# Patient Record
Sex: Female | Born: 1990 | Race: White | Hispanic: No | Marital: Single | Smoking: Current every day smoker
Health system: Southern US, Community
[De-identification: ages and names within clinical notes are randomized; demographics above are authoritative.]

## PROBLEM LIST (undated history)

## (undated) DIAGNOSIS — F1910 Other psychoactive substance abuse, uncomplicated: Secondary | ICD-10-CM

## (undated) DIAGNOSIS — R569 Unspecified convulsions: Secondary | ICD-10-CM

## (undated) DIAGNOSIS — F419 Anxiety disorder, unspecified: Secondary | ICD-10-CM

## (undated) HISTORY — PX: TONSILLECTOMY: SUR1361

## (undated) HISTORY — PX: APPENDECTOMY: SHX54

---
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
## 2008-05-30 ENCOUNTER — Observation Stay (HOSPITAL_COMMUNITY): Admission: AD | Admit: 2008-05-30 | Discharge: 2008-05-31 | Payer: Self-pay | Admitting: Obstetrics and Gynecology

## 2008-06-10 ENCOUNTER — Encounter: Admission: RE | Admit: 2008-06-10 | Discharge: 2008-07-03 | Payer: Self-pay | Admitting: Obstetrics & Gynecology

## 2010-10-12 ENCOUNTER — Inpatient Hospital Stay (HOSPITAL_COMMUNITY)
Admission: EM | Admit: 2010-10-12 | Discharge: 2010-10-14 | Payer: Self-pay | Source: Home / Self Care | Admitting: Emergency Medicine

## 2010-10-13 ENCOUNTER — Encounter (INDEPENDENT_AMBULATORY_CARE_PROVIDER_SITE_OTHER): Payer: Self-pay

## 2011-02-16 LAB — URINALYSIS, ROUTINE W REFLEX MICROSCOPIC
Bilirubin Urine: NEGATIVE
Glucose, UA: NEGATIVE mg/dL
Ketones, ur: NEGATIVE mg/dL
Nitrite: NEGATIVE
Protein, ur: NEGATIVE mg/dL
pH: 6 (ref 5.0–8.0)

## 2011-02-16 LAB — DIFFERENTIAL
Basophils Absolute: 0 10*3/uL (ref 0.0–0.1)
Lymphocytes Relative: 18 % (ref 12–46)
Lymphs Abs: 3 10*3/uL (ref 0.7–4.0)
Monocytes Absolute: 0.8 10*3/uL (ref 0.1–1.0)
Monocytes Relative: 4 % (ref 3–12)
Neutro Abs: 13 10*3/uL — ABNORMAL HIGH (ref 1.7–7.7)

## 2011-02-16 LAB — BASIC METABOLIC PANEL
Chloride: 105 mEq/L (ref 96–112)
GFR calc non Af Amer: >60 mL/min (ref >60)
Potassium: 3.9 mEq/L (ref 3.5–5.1)
Sodium: 140 mEq/L (ref 135–145)

## 2011-02-16 LAB — CBC
HCT: 45.6 % (ref 36.0–46.0)
Hemoglobin: 15.6 g/dL — ABNORMAL HIGH (ref 12.0–15.0)
WBC: 17 10*3/uL — ABNORMAL HIGH (ref 4.0–10.5)

## 2011-02-16 LAB — WET PREP, GENITAL

## 2011-02-16 LAB — PREGNANCY, URINE: Preg Test, Ur: NEGATIVE
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
## 2011-04-23 NOTE — Discharge Summary (Signed)
Charlotte Mitchell, Charlotte Mitchell               ACCOUNT NO.:  [id number]   MEDICAL RECORD NO.:  [id number]          PATIENT TYPE:  INP   LOCATION:  9142                          [facility]ILITY[facility]  [facility]   PHYSICIAN:  Juan H. Lily Peer, M.D.DATE OF BIRTH:  08-11-1991   DATE OF ADMISSION:  12/03/2006  DATE OF DISCHARGE:  12/06/2006                               DISCHARGE SUMMARY   TOTAL DAYS HOSPITALIZED:  Three.   HISTORY:  Patient is a 20 year old, gravida 2, para 0, abortions (AB) 1  at [redacted] weeks gestation who had been seen in the office for an ultrasound  for growth which revealed the patient to be in a breech presentation  with a low AFI of 4.4.  Patient was taken to the operating room for a  primary cesarean section.  Patient's pregnancy had been complicated due  to chronic smoking despite numerous times that she was counseled.  Also  in her pregnancy, she had positive bacterial vaginosis and positive  Chlamydia which were treated respectively.  The patient delivered by  primary lower uterine segment cesarean section by Dr. Colin Broach.  Patient delivered a viable female infant.  Apgars were 9 and 9 with a  weight of 7 pounds and 3 ounces.  Postoperatively, the patient did well.  Her diet was advanced from a clear to a regular diet.  Her patient  controlled analgesia (PCA) pump was discontinued after 24 hours as well  as her Foley catheter.  She began to ambulate, and by her third  postoperative day, she was up and ambulating, tolerating a regular diet  well, and passing flatus.  Her postoperative hemoglobin was 10.7, and  her blood type was O positive.   FINAL DIAGNOSIS:  1. Term intrauterine pregnancy.  2. Intrauterine growth restriction.  3. Oligohydramnios.  4. Homero Fellers breech presentation.   PROCEDURE PERFORMED:  Primary lower uterine segment transverse cesarean  section.   FINAL DISPOSITION AND FOLLOW-UP:  The patient was discharged home on the  third postoperative day.  She was up and  ambulating and tolerating a  regular diet well.  Her incision site was intact.  Her lochia had been  decreasing.  Patient was given a prescription for Lortab 5.0/500 mg to  take one p.o. q.4-6h. p.r.n. pain and also to continue her prenatal  vitamins and iron.  She was encouraged once again to discontinue  smoking, and she will be seen in the office in four weeks for a  postpartum visit.      Juan H. Lily Peer, M.D.  Electronically Signed     JHF/MEDQ  D:  12/06/2006  T:  12/06/2006  Job:  [id number]

## 2011-04-23 NOTE — Op Note (Signed)
NAMEAVAGRACE, BOTELHO               ACCOUNT NO.:  [id number]   MEDICAL RECORD NO.:  [id number]          PATIENT TYPE:  INP   LOCATION:  9142                          [facility]   PHYSICIAN:  Timothy P. Fontaine, M.D.DATE OF BIRTH:  December 05, 1991   DATE OF PROCEDURE:  12/03/2006  DATE OF DISCHARGE:                               OPERATIVE REPORT   PREOPERATIVE DIAGNOSIS:  Term pregnancy intrauterine growth retardation  evident by abdominal circumference third percentile, oligohydramnios  evident by AMI 4.4, frank breech presentation.   POSTOPERATIVE DIAGNOSES:  Term pregnancy intrauterine growth retardation  evident by abdominal circumference third percentile, oligohydramnios  evident by AMI 4.4, frank breech presentation.   PROCEDURE:  Primary low transverse cervical cesarean section.   SURGEON:  Timothy P. Fontaine, M.D.   ASSISTANT:  Scrub technician.   ANESTHETIC:  Spinal.   ESTIMATED BLOOD LOSS:  Less than 500 mL.   COMPLICATIONS:  None.   SPECIMEN:  Samples of cord blood, placenta, and umbilical cord.   FINDINGS:  At 0830 normal female, Apgars 9 and 9, frank breech  presentation, weight 7 pounds 3 ounces.  Pelvic anatomy noted to be  normal.   DESCRIPTION OF PROCEDURE:  The patient was taken to the operating room,  and underwent spinal anesthesia; was placed in the left tilt supine  position; received an abdominal preparation with Betadine solution;  bladder emptied with indwelling Foley catheterization; placed in sterile  technique.  The patient was draped in the usual fashion.  After assuring  adequate anesthesia, the abdomen was sharply entered through a  Pfannenstiel incision achieving adequate hemostasis at all levels.  The  bladder flap was sharply-bluntly developed without difficulty.  Uterus  was sharply entered in the lower uterine segment, and bluntly extended  laterally.  The bulging membranes ruptured; fluid noted to be clear.  The infant delivered at  the frank breech presentation.  The nares and  mouth suctioned.  The cord doubly clamped and cut; and the infant was  handed to pediatrics in attendance.  Samples of cord blood were  obtained.  Public cord blood banking was not consented.  The placenta  was spontaneously extruded and noted to be intact; and was sent to  pathology.  The uterus was exteriorized, endometrial cavity explored  with a sponge to remove all placental membrane fragments.   The patient received 1 gram Ancef antibiotic prophylaxis at this time.  The uterine incision was closed in two layers using #0 Vicryl suture  first in a running interlocking stitch, followed by an imbricating  stitch.  Uterus was returned to the abdomen which was copiously  irrigated.  Adequate hemostasis visualized.  The anterior fascia was  reapproximated using #0 Vicryl in a running suture.  The subcutaneous  tissues were irrigated.  Hemostasis achieved  with electrocautery.  The skin reapproximated using 4-0 Vicryl in a  running subcuticular stitch.  Steri-Strips, Benzoin applied.  Sterile  dressing applied.  The patient was taken to recovery room in good  condition having tolerated the procedure well.      Timothy P. Fontaine, M.D.  Electronically Signed  TPF/MEDQ  D:  12/03/2006  T:  12/03/2006  Job:  [id number]

## 2011-04-23 NOTE — H&P (Signed)
Charlotte Mitchell, Charlotte Mitchell               ACCOUNT NO.:  [id number]   MEDICAL RECORD NO.:  [id number]          PATIENT TYPE:  INP   LOCATION:                                [facility]   PHYSICIAN:  Timothy P. Fontaine, M.D.DATE OF BIRTH:  May 18, 1991   DATE OF ADMISSION:  12/03/2006  DATE OF DISCHARGE:                              HISTORY & PHYSICAL   She is being admitted to [facility] December 29, surgery 8 a.m.   CHIEF COMPLAINT:  Pregnancy at 39 weeks, breech presentation,  oligohydramnios.   HISTORY OF PRESENT ILLNESS:  A 20 year old G2, P0, AB1 female [redacted] weeks  gestation.  Ultrasound for growth revealed the patient to be in the  breech presentation with an AFI of 4.4 cm.  Abdominal circumference is  in the 3rd percentile and she is admitted at this time for a primary  cesarean section.  Prenatal course has been complicated by cigarette  smoking, positive BV, positive Chlamydia screen at her new OB.  Patient  was treated with antibiotics, had a follow up chlamydial and GC culture  October 07, 2006, which were negative.  Her beta Strep screen is also  negative.  For remainder of her history, see her [facility] Exam.  HEENT:  Normal.  LUNGS:  Clear.  CARDIAC:  Regular rate without rubs, murmurs or gallops.  ABDOMINAL:  Gravid fetus, Leopold's confirms breech, positive fetal  heart tones.  PELVIC:  Cervix tight, fingertip high.   ASSESSMENT AND PLAN:  A 20 year old gravida 2, para 0, abortus 1 female,  cigarette smoker, infant abdominal circumference in the 3rd percentile  at 39 weeks, amniotic fluid index 4.4 centimeters, breech presentation.  Options for management were reviewed and we all decide cesarean section  to be the most prudent course of action.  It is 5 p.m. at the time of  this dictation.  She has just eaten a meal prior to office visit.  Will  plan on nonstress test now for reassurance.  Assuming reactive nonstress  test at the hospital, will schedule for  cesarean section in the morning  and patient is to return in the morning for preoperative preparation.  If question with nonstress test, then will proceed with cesarean section  this evening despite having had a recent meal.  I reviewed what is  involved with a cesarean section, expected intraoperative, postoperative  courses, the risks, benefits and the risks of bleeding, transfusion,  infection prolonged antibiotics, wound complications requiring opening  and draining of incisions, closure by secondary intention, long term  wound complications to include hernia formation all reviewed.  The risk  of inadvertent injury to internal organs including bowel, bladder,  ureters, vessels and nerves necessitating major exploratory reparative  surgeries in the future, reparative surgeries including bowel resection,  ostomy formation, bladder repair all discussed, understood and accepted.  The risks of fetal injury during the birthing process, musculoskeletal,  neural, scalpel injuries were all reviewed.  The patient's questions  were answered to her satisfaction and she is ready to proceed with  surgery.      Timothy P. Fontaine, M.D.  Electronically Signed    TPF/MEDQ  D:  12/02/2006  T:  12/02/2006  Job:  [id number]

## 2011-06-10 ENCOUNTER — Inpatient Hospital Stay (HOSPITAL_COMMUNITY): Payer: Self-pay

## 2011-06-10 ENCOUNTER — Inpatient Hospital Stay (HOSPITAL_COMMUNITY)
Admission: AD | Admit: 2011-06-10 | Discharge: 2011-06-10 | Disposition: A | Discharge status: Home / Self Care | Payer: Self-pay | Source: Ambulatory Visit | Attending: Obstetrics and Gynecology | Admitting: Obstetrics and Gynecology

## 2011-06-10 DIAGNOSIS — R109 Unspecified abdominal pain: Secondary | ICD-10-CM | POA: Insufficient documentation

## 2011-06-10 DIAGNOSIS — O9989 Other specified diseases and conditions complicating pregnancy, childbirth and the puerperium: Secondary | ICD-10-CM

## 2011-06-10 DIAGNOSIS — O99891 Other specified diseases and conditions complicating pregnancy: Secondary | ICD-10-CM | POA: Insufficient documentation

## 2011-06-10 LAB — HCG, QUANTITATIVE, PREGNANCY: hCG, Beta Chain, Quant, S: 1242 m[IU]/mL — ABNORMAL HIGH (ref <5)

## 2011-06-10 LAB — URINALYSIS, ROUTINE W REFLEX MICROSCOPIC
Hgb urine dipstick: NEGATIVE
Protein, ur: NEGATIVE mg/dL
Urobilinogen, UA: 0.2 mg/dL (ref 0.0–1.0)

## 2011-06-10 LAB — URINE MICROSCOPIC-ADD ON

## 2011-06-10 LAB — WET PREP, GENITAL: Yeast Wet Prep HPF POC: NONE SEEN

## 2011-06-10 LAB — POCT PREGNANCY, URINE: Preg Test, Ur: POSITIVE

## 2011-06-12 ENCOUNTER — Inpatient Hospital Stay (HOSPITAL_COMMUNITY)
Admission: AD | Admit: 2011-06-12 | Discharge: 2011-06-12 | Disposition: A | Discharge status: Home / Self Care | Payer: Self-pay | Source: Ambulatory Visit | Attending: Family Medicine | Admitting: Family Medicine

## 2011-06-12 ENCOUNTER — Inpatient Hospital Stay (HOSPITAL_COMMUNITY): Payer: Self-pay | Admitting: Family Medicine

## 2011-06-12 ENCOUNTER — Encounter (HOSPITAL_COMMUNITY): Payer: Self-pay | Admitting: *Deleted

## 2011-06-12 ENCOUNTER — Ambulatory Visit (HOSPITAL_COMMUNITY): Payer: Self-pay

## 2011-06-12 DIAGNOSIS — O99891 Other specified diseases and conditions complicating pregnancy: Secondary | ICD-10-CM | POA: Insufficient documentation

## 2011-06-12 NOTE — Initial Assessments (Signed)
Pt states, " I am here for repeat labwork

## 2011-06-13 LAB — URINE CULTURE
Colony Count: 55000
Culture  Setup Time: 201207052254

## 2011-06-16 ENCOUNTER — Telehealth (HOSPITAL_COMMUNITY): Payer: Self-pay | Admitting: Obstetrics and Gynecology

## 2011-06-16 NOTE — ED Provider Notes (Signed)
Pt called regarding positive urine culture. No answer. Left message for pt to return call. Urine culture was positive for proteus mirabilis.    Henrietta Hoover, [place] 06/16/11 [phone]

## 2011-06-17 NOTE — ED Notes (Signed)
  Pt. Has not returned call. Will send letter. [address], FNP   [place], [place] 06/17/11 [phone]

## 2011-08-12 ENCOUNTER — Encounter (HOSPITAL_COMMUNITY): Payer: Self-pay | Admitting: Obstetrics and Gynecology

## 2011-08-12 ENCOUNTER — Inpatient Hospital Stay (HOSPITAL_COMMUNITY)
Admission: AD | Admit: 2011-08-12 | Discharge: 2011-08-12 | Disposition: A | Discharge status: Home / Self Care | Payer: Self-pay | Source: Ambulatory Visit | Attending: Obstetrics & Gynecology | Admitting: Obstetrics & Gynecology

## 2011-08-12 ENCOUNTER — Encounter (HOSPITAL_COMMUNITY): Payer: Self-pay | Admitting: *Deleted

## 2011-08-12 DIAGNOSIS — O99330 Smoking (tobacco) complicating pregnancy, unspecified trimester: Secondary | ICD-10-CM | POA: Insufficient documentation

## 2011-08-12 DIAGNOSIS — O99891 Other specified diseases and conditions complicating pregnancy: Secondary | ICD-10-CM | POA: Insufficient documentation

## 2011-08-12 DIAGNOSIS — Z3201 Encounter for pregnancy test, result positive: Secondary | ICD-10-CM | POA: Insufficient documentation

## 2011-08-12 DIAGNOSIS — K5900 Constipation, unspecified: Secondary | ICD-10-CM

## 2011-08-12 DIAGNOSIS — O2692 Pregnancy related conditions, unspecified, second trimester: Secondary | ICD-10-CM

## 2011-08-12 DIAGNOSIS — O2690 Pregnancy related conditions, unspecified, unspecified trimester: Secondary | ICD-10-CM

## 2011-08-12 LAB — WET PREP, GENITAL
Clue Cells Wet Prep HPF POC: NONE SEEN
Trich, Wet Prep: NONE SEEN
Yeast Wet Prep HPF POC: NONE SEEN

## 2011-08-12 LAB — URINALYSIS, ROUTINE W REFLEX MICROSCOPIC
Glucose, UA: NEGATIVE mg/dL
Ketones, ur: NEGATIVE mg/dL
Leukocytes, UA: NEGATIVE
pH: 6 (ref 5.0–8.0)

## 2011-08-12 NOTE — Progress Notes (Signed)
SSE done per Mayer Camel, NP.  Wet prep and cultures collected.  VE done.

## 2011-08-12 NOTE — Progress Notes (Signed)
Pt reports she was seen here 2 months ago and told she was pregnant. States we were unable to determine gest because we "couldn't see anything". States she returned for follow up BHCG's. Has been having cramping off/on for 2 months. LMP 04/20/2011

## 2011-08-12 NOTE — ED Provider Notes (Signed)
History     CSN: [id number] Arrival date & time: 08/12/2011  8:21 PM  Chief Complaint  Patient presents with  . Possible Pregnancy   HPI Charlotte Mitchell is a 20 y.o. female who presents to [facility] for follow up of her pregnancy. Problem with constipation. Has had occasional cramping. On previous visit patient had culture positive for chlamydia. She states she and her partner were treated. States she is here tonight to get a pregnancy verification letter so she can get her insurance and start her prenatal care. The history was provided by the patient.  Past Medical History  Diagnosis Date  . No pertinent past medical history     Past Surgical History  Procedure Date  . Cesarean section   . Appendectomy   . Tonsillectomy     No family history on file.  History  Substance Use Topics  . Smoking status: Current Everyday Smoker -- 0.5 packs/day for 9 years    Types: Cigarettes  . Smokeless tobacco: Never Used  . Alcohol Use: 0.6 oz/week    1 Cans of beer per week    OB History    Grav Para Term Preterm Abortions TAB SAB Ect Mult Living   4 2 2       2       Review of Systems  Gastrointestinal: Positive for constipation.       Occasional low abdominal cramping.  Genitourinary:       Pregnant.  All other systems reviewed and are negative.    Physical Exam  BP 120/81  Pulse 77  Temp 98.5 F (36.9 C)  Resp 16  Ht 5\' 4"  (1.626 m)  Wt 163 lb (73.936 kg)  BMI 27.98 kg/m2  LMP 04/20/2011  Physical Exam  Nursing note and vitals reviewed. Constitutional: She is oriented to person, place, and time. She appears well-developed and well-nourished.  Eyes: EOM are normal.  Neck: Neck supple.  Pulmonary/Chest: Effort normal.  Abdominal: Soft. There is no tenderness.       Positive FHT.  Genitourinary:       Yellow mucous discharge vaginal vault. Cervix long and closed. No CMT. Uterus 14 - 16 week size.  Musculoskeletal: Normal range of motion.  Neurological: She is alert and  oriented to person, place, and time. No cranial nerve deficit.  Skin: Skin is warm and dry.    ED Course  Procedures  MDM  Informal bedside ultrasound shows an active IUP with cardiac activity.   Assessment: Second trimester pregnancy    Constipation  Plan:  High fiber diet   Pregnancy verification letter     Kerrie Buffalo, NP 08/12/11 [phone]

## 2011-08-12 NOTE — Progress Notes (Signed)
Pt presents to [facility] for check up for pregnancy to get medicaid card.  Has been having cramping the last couple of nights.  Had prior complications with last 2 pregnancies.  Had emergency c/s for abruption with last pregnancy.

## 2011-08-12 NOTE — Progress Notes (Signed)
Bedside US done per Mayer Camel, NP.

## 2011-08-12 NOTE — Progress Notes (Signed)
H. Neese, NP at bedside.  Assessment done and poc discussed with pt.  

## 2011-08-15 NOTE — ED Provider Notes (Signed)
Agree with above note.  Charlotte Mitchell H. 08/15/2011 2:53 PM

## 2011-08-25 ENCOUNTER — Other Ambulatory Visit: Payer: Self-pay | Admitting: Obstetrics and Gynecology

## 2011-08-25 LAB — ABO/RH: RH Type: POSITIVE

## 2011-08-25 LAB — HIV ANTIBODY (ROUTINE TESTING W REFLEX): HIV: NONREACTIVE

## 2011-08-25 LAB — RPR: RPR: NONREACTIVE

## 2011-08-25 LAB — RUBELLA ANTIBODY, IGM: Rubella: IMMUNE

## 2011-09-02 LAB — TYPE AND SCREEN: Antibody Screen: NEGATIVE

## 2011-09-02 LAB — CBC
MCHC: 35
MCV: 100.5 — ABNORMAL HIGH
RBC: 3.54 — ABNORMAL LOW
RDW: 12.9

## 2011-09-02 LAB — PROTIME-INR
INR: 1
Prothrombin Time: 12.9

## 2011-11-21 IMAGING — CT CT ABD-PELV W/ CM
1 series · 15 of 32 positions shown, 19 images · IV contrast (omnipaque)
Comparison: None.

CLINICAL DATA: Right lower quadrant abdominal pain with nausea,
vomiting and diarrhea.

CT ABDOMEN AND PELVIS WITH CONTRAST
TECHNIQUE: Multidetector CT imaging of the abdomen and pelvis was
performed following the standard protocol during bolus
administration of intravenous contrast.
Contrast: 100 ml Omnipaque-IJJ intravenously.

[Series 2: rtn ap with st · axial · 0.58mm/px · z∈[+695,+1105]mm · 15 of 91 slices shown, 19 images]
[im 6/91  soft-tissue]
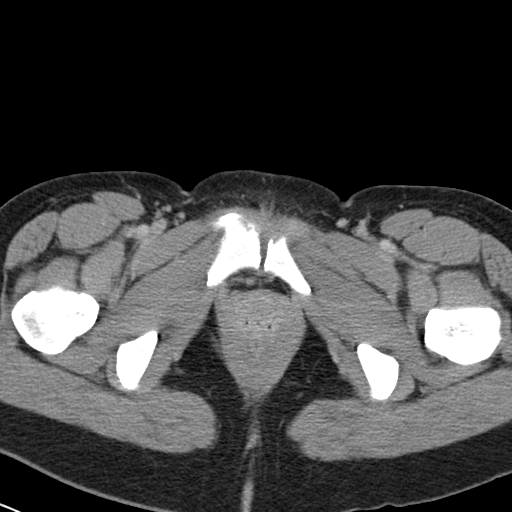
[im 6/91  bone]
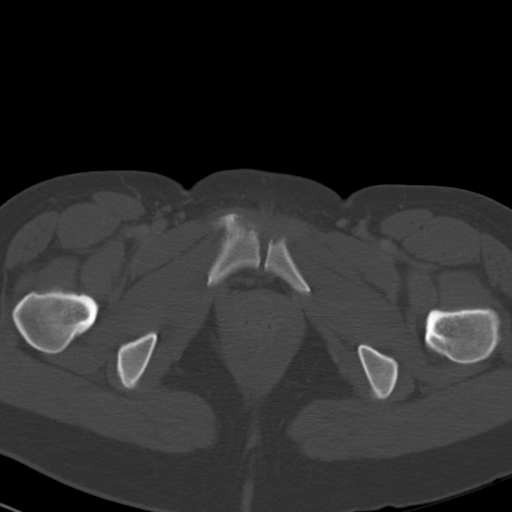
[im 12/91  soft-tissue]
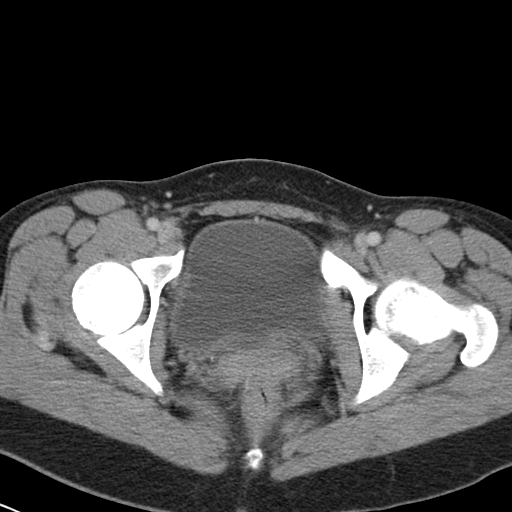
[im 18/91  soft-tissue]
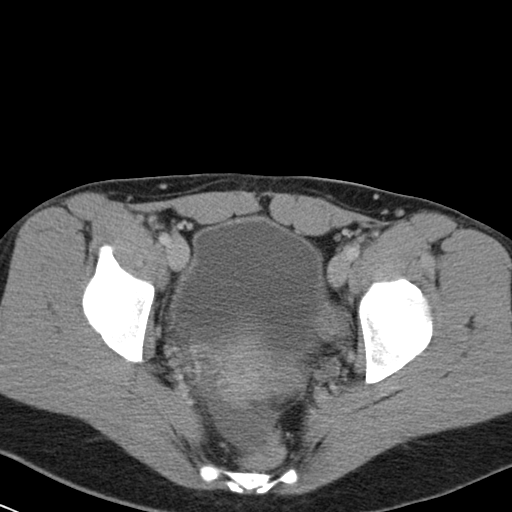
[im 27/91  soft-tissue]
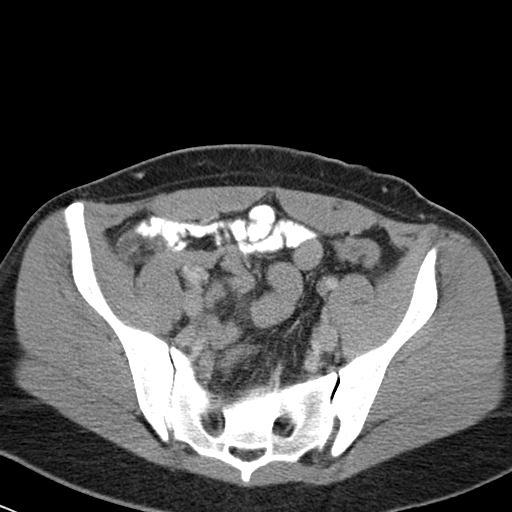
[im 32/91  soft-tissue]
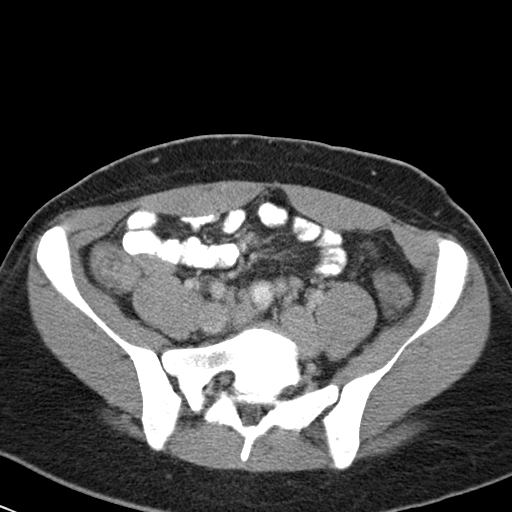
[im 38/91  soft-tissue]
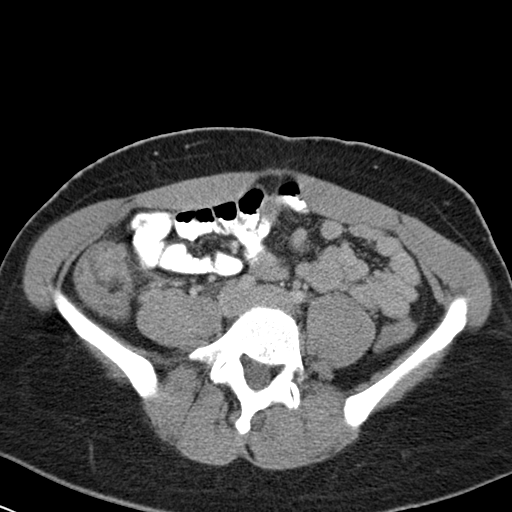
[im 47/91  soft-tissue]
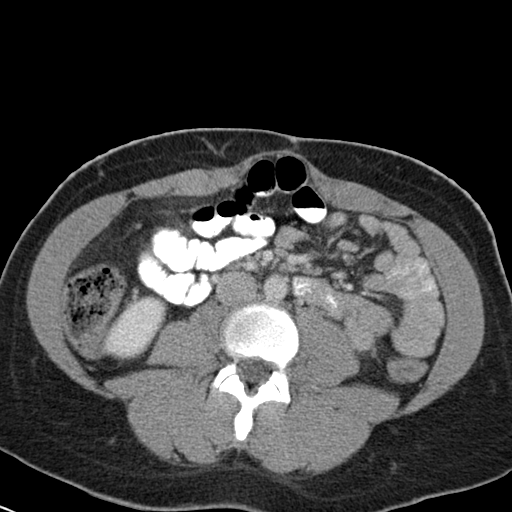
[im 53/91  soft-tissue]
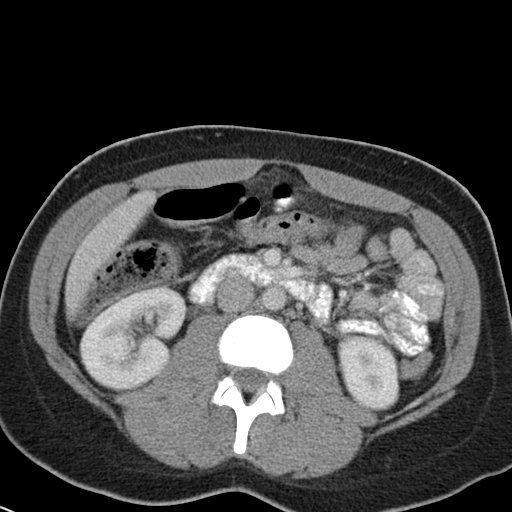
[im 59/91  soft-tissue]
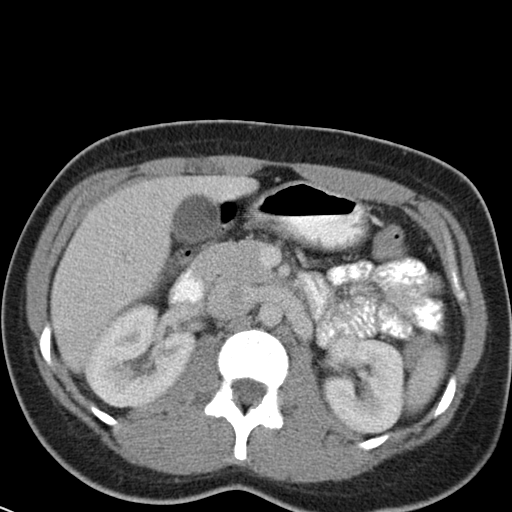
[im 59/91  bone]
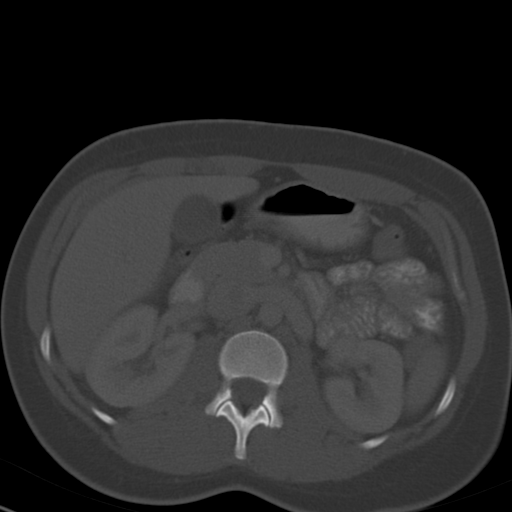
[im 64/91  soft-tissue]
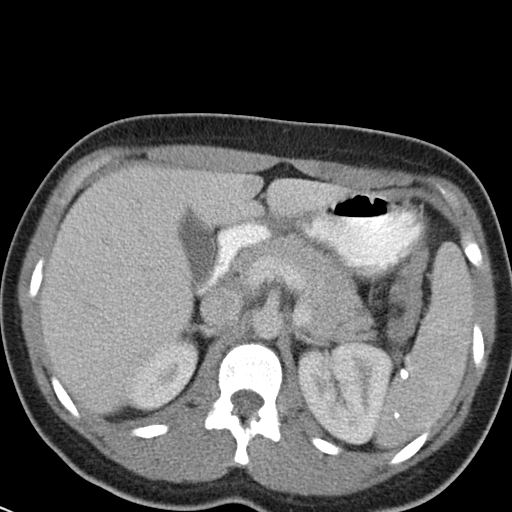
[im 73/91  soft-tissue]
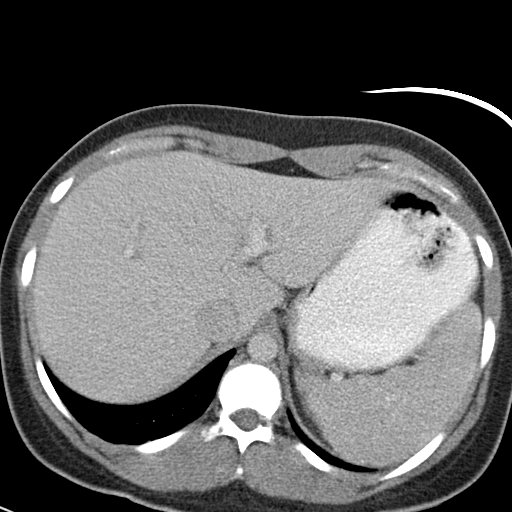
[im 79/91  soft-tissue]
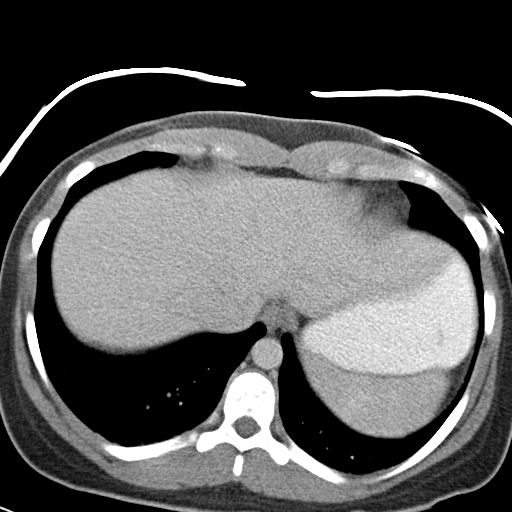
[im 79/91  lung]
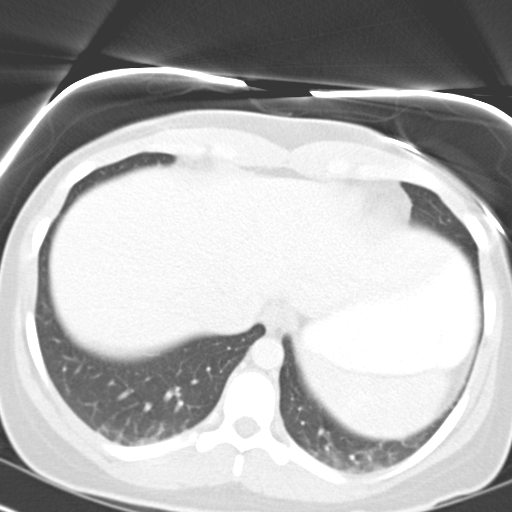
[im 82/91  lung]
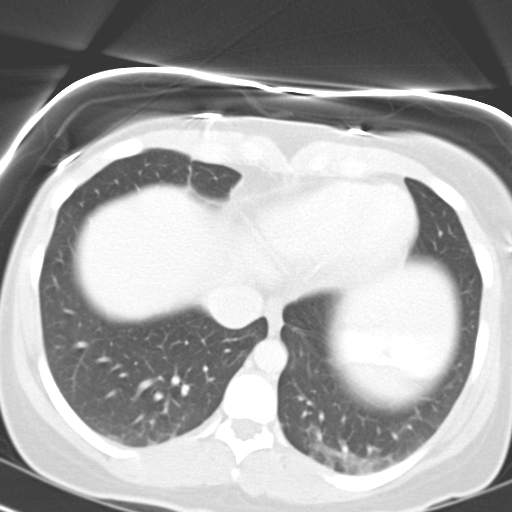
[im 85/91  soft-tissue]
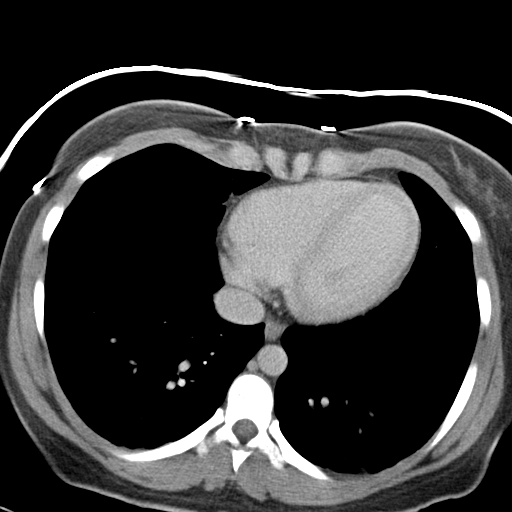
[im 85/91  lung]
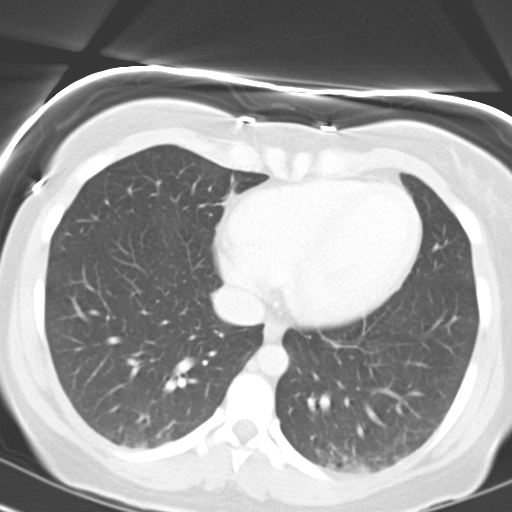
[im 88/91  lung]
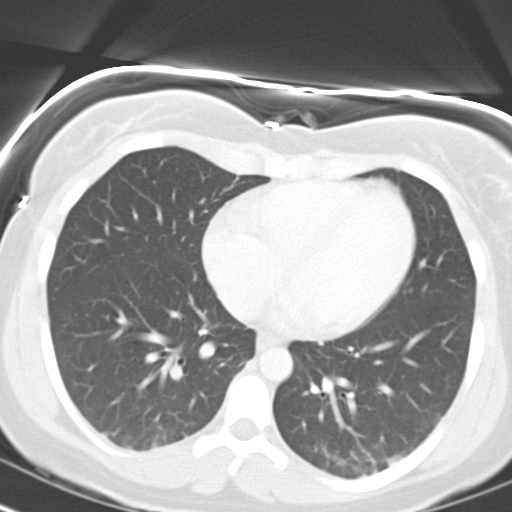

[15 of 32 positions shown; findings below may reference images not displayed]

FINDINGS: There is mild dependent atelectasis at both lung bases.
No pleural effusion is demonstrated.  There are small calcified
splenic granulomas.  The spleen, liver, gallbladder, pancreas,
adrenal glands and kidneys otherwise appear normal.

The appendix is distended to 11 mm and demonstrates wall thickening
and surrounding inflammation.  There are probable appendicoliths
within the appendiceal lumen at the base and near the tip of the
appendix.  There is no evidence of bowel obstruction.  A small
amount of free pelvic fluid is nonspecific.  There is a collapsing
right ovarian follicle.  The uterus, left ovary and urinary bladder
appear unremarkable.
IMPRESSION: 1.  Findings are consistent with acute appendicitis.  There is no
evidence of perforation or abscess.
2.  Collapsing right ovarian follicle likely accounts for a small
amount of free pelvic fluid.  There is no focal extraluminal fluid
collection.

Critical test results telephoned to Dr. Bolo Ndha at the time of
interpretation on 10/12/2010 at 6122 hours.

## 2012-01-17 ENCOUNTER — Encounter (HOSPITAL_COMMUNITY): Payer: Self-pay

## 2012-01-27 ENCOUNTER — Encounter (HOSPITAL_COMMUNITY): Payer: Self-pay

## 2012-01-28 ENCOUNTER — Encounter (HOSPITAL_COMMUNITY): Payer: Self-pay

## 2012-01-28 ENCOUNTER — Inpatient Hospital Stay (HOSPITAL_COMMUNITY): Admission: RE | Admit: 2012-01-28 | Payer: Medicaid Other | Source: Ambulatory Visit

## 2012-01-28 ENCOUNTER — Encounter (HOSPITAL_COMMUNITY)
Admission: RE | Admit: 2012-01-28 | Discharge: 2012-01-28 | Disposition: A | Discharge status: Home / Self Care | Payer: Medicaid Other | Source: Ambulatory Visit | Attending: Obstetrics and Gynecology | Admitting: Obstetrics and Gynecology

## 2012-01-28 LAB — SURGICAL PCR SCREEN
MRSA, PCR: NEGATIVE
Staphylococcus aureus: POSITIVE — AB

## 2012-01-28 LAB — CBC
HCT: 36.9 % (ref 36.0–46.0)
Hemoglobin: 12.4 g/dL (ref 12.0–15.0)
MCH: 33.2 pg (ref 26.0–34.0)
MCHC: 33.6 g/dL (ref 30.0–36.0)
RDW: 12.7 % (ref 11.5–15.5)

## 2012-01-28 NOTE — Patient Instructions (Addendum)
20 Charlotte Mitchell  01/28/2012   Your procedure is scheduled on:  02/03/12  Enter through the Main Entrance of [facility] at 6 AM.  Pick up the phone at the desk and dial [phone].   Call this number if you have problems the morning of surgery: [phone]   Remember:   Do not eat food:After Midnight.  Do not drink clear liquids: After Midnight.  Take these medicines the morning of surgery with A SIP OF WATER: NA   Do not wear jewelry, make-up or nail polish.  Do not wear lotions, powders, or perfumes. You may wear deodorant.  Do not shave 48 hours prior to surgery.  Do not bring valuables to the hospital.  Contacts, dentures or bridgework may not be worn into surgery.  Leave suitcase in the car. After surgery it may be brought to your room.  For patients admitted to the hospital, checkout time is 11:00 AM the day of discharge.   Patients discharged the day of surgery will not be allowed to drive home.  Name and phone number of your driver: NA  Special Instructions: CHG Shower Use Special Wash: 1/2 bottle night before surgery and 1/2 bottle morning of surgery.   Please read over the following fact sheets that you were given: MRSA Information

## 2012-02-03 ENCOUNTER — Encounter (HOSPITAL_COMMUNITY): Payer: Self-pay | Admitting: General Surgery

## 2012-02-03 ENCOUNTER — Encounter (HOSPITAL_COMMUNITY): Payer: Self-pay | Admitting: Anesthesiology

## 2012-02-03 ENCOUNTER — Encounter (HOSPITAL_COMMUNITY): Payer: Self-pay | Admitting: *Deleted

## 2012-02-03 ENCOUNTER — Other Ambulatory Visit: Payer: Self-pay | Admitting: Obstetrics and Gynecology

## 2012-02-03 ENCOUNTER — Inpatient Hospital Stay (HOSPITAL_COMMUNITY): Payer: Medicaid Other | Admitting: Anesthesiology

## 2012-02-03 ENCOUNTER — Inpatient Hospital Stay (HOSPITAL_COMMUNITY)
Admission: RE | Admit: 2012-02-03 | Discharge: 2012-02-06 | DRG: 766 | Disposition: A | Discharge status: Home / Self Care | Payer: Medicaid Other | Source: Ambulatory Visit | Attending: Obstetrics and Gynecology | Admitting: Obstetrics and Gynecology

## 2012-02-03 ENCOUNTER — Encounter (HOSPITAL_COMMUNITY)
Admission: RE | Disposition: A | Discharge status: Home / Self Care | Payer: Self-pay | Source: Ambulatory Visit | Attending: Obstetrics and Gynecology

## 2012-02-03 DIAGNOSIS — Z01812 Encounter for preprocedural laboratory examination: Secondary | ICD-10-CM

## 2012-02-03 DIAGNOSIS — Z01818 Encounter for other preprocedural examination: Secondary | ICD-10-CM

## 2012-02-03 DIAGNOSIS — O34219 Maternal care for unspecified type scar from previous cesarean delivery: Principal | ICD-10-CM | POA: Diagnosis present

## 2012-02-03 LAB — TYPE AND SCREEN
ABO/RH(D): O POS
Antibody Screen: NEGATIVE

## 2012-02-03 SURGERY — Surgical Case
Anesthesia: Spinal | Site: Abdomen | Wound class: Clean Contaminated

## 2012-02-03 MED ORDER — ONDANSETRON HCL 4 MG/2ML IJ SOLN: 4.0000 mg | INTRAMUSCULAR | Status: DC | PRN

## 2012-02-03 MED ORDER — ONDANSETRON HCL 4 MG/2ML IJ SOLN
INTRAMUSCULAR | Status: DC | PRN
  Administered 2012-02-03: 4 mg via INTRAVENOUS

## 2012-02-03 MED ORDER — IBUPROFEN 600 MG PO TABS
600.0000 mg | ORAL_TABLET | Freq: Four times a day (QID) | ORAL | Status: DC
  Administered 2012-02-03 – 2012-02-06 (×11): 600 mg via ORAL
  Filled 2012-02-03 (×3): qty 1
  Filled 2012-02-03: qty 2
  Filled 2012-02-03 (×7): qty 1

## 2012-02-03 MED ORDER — FLEET ENEMA 7-19 GM/118ML RE ENEM: 1.0000 | ENEMA | Freq: Every day | RECTAL | Status: DC | PRN

## 2012-02-03 MED ORDER — OXYCODONE-ACETAMINOPHEN 5-325 MG PO TABS
1.0000 | ORAL_TABLET | ORAL | Status: DC | PRN
  Administered 2012-02-03: 1 via ORAL
  Administered 2012-02-04 (×2): 2 via ORAL
  Administered 2012-02-04: 1 via ORAL
  Administered 2012-02-04: 2 via ORAL
  Administered 2012-02-05: 1 via ORAL
  Administered 2012-02-05 – 2012-02-06 (×3): 2 via ORAL
  Filled 2012-02-03: qty 2
  Filled 2012-02-03: qty 1
  Filled 2012-02-03 (×3): qty 2
  Filled 2012-02-03 (×2): qty 1
  Filled 2012-02-03 (×2): qty 2

## 2012-02-03 MED ORDER — BISACODYL 10 MG RE SUPP: 10.0000 mg | Freq: Every day | RECTAL | Status: DC | PRN

## 2012-02-03 MED ORDER — DIPHENHYDRAMINE HCL 50 MG/ML IJ SOLN: 12.5000 mg | INTRAMUSCULAR | Status: DC | PRN

## 2012-02-03 MED ORDER — KETOROLAC TROMETHAMINE 30 MG/ML IJ SOLN: 30.0000 mg | Freq: Four times a day (QID) | INTRAMUSCULAR | Status: AC | PRN

## 2012-02-03 MED ORDER — OXYTOCIN 10 UNIT/ML IJ SOLN
INTRAMUSCULAR | Status: AC
  Filled 2012-02-03: qty 2

## 2012-02-03 MED ORDER — OXYTOCIN 20 UNITS IN LACTATED RINGERS INFUSION - SIMPLE: 125.0000 mL/h | INTRAVENOUS | Status: AC

## 2012-02-03 MED ORDER — SIMETHICONE 80 MG PO CHEW: 80.0000 mg | CHEWABLE_TABLET | ORAL | Status: DC | PRN

## 2012-02-03 MED ORDER — DIPHENHYDRAMINE HCL 25 MG PO CAPS
25.0000 mg | ORAL_CAPSULE | ORAL | Status: DC | PRN
  Administered 2012-02-06: 25 mg via ORAL

## 2012-02-03 MED ORDER — KETOROLAC TROMETHAMINE 60 MG/2ML IM SOLN
INTRAMUSCULAR | Status: AC
  Administered 2012-02-03: 60 mg via INTRAMUSCULAR
  Filled 2012-02-03: qty 2

## 2012-02-03 MED ORDER — FENTANYL CITRATE 0.05 MG/ML IJ SOLN
INTRAMUSCULAR | Status: AC
  Administered 2012-02-03: 50 ug via INTRAVENOUS
  Filled 2012-02-03: qty 2

## 2012-02-03 MED ORDER — METOCLOPRAMIDE HCL 5 MG/ML IJ SOLN: 10.0000 mg | Freq: Three times a day (TID) | INTRAMUSCULAR | Status: DC | PRN

## 2012-02-03 MED ORDER — NALBUPHINE HCL 10 MG/ML IJ SOLN
5.0000 mg | INTRAMUSCULAR | Status: DC | PRN
  Filled 2012-02-03: qty 1

## 2012-02-03 MED ORDER — LACTATED RINGERS IV SOLN
INTRAVENOUS | Status: DC
  Administered 2012-02-03 (×2): via INTRAVENOUS
  Administered 2012-02-03: 1000 mL via INTRAVENOUS

## 2012-02-03 MED ORDER — PHENYLEPHRINE HCL 10 MG/ML IJ SOLN
INTRAMUSCULAR | Status: DC | PRN
  Administered 2012-02-03 (×2): 80 ug via INTRAVENOUS

## 2012-02-03 MED ORDER — MORPHINE SULFATE (PF) 0.5 MG/ML IJ SOLN
INTRAMUSCULAR | Status: DC | PRN
  Administered 2012-02-03: 4 mg via INTRAVENOUS
  Administered 2012-02-03: .15 mg via INTRATHECAL

## 2012-02-03 MED ORDER — DIPHENHYDRAMINE HCL 50 MG/ML IJ SOLN: 25.0000 mg | INTRAMUSCULAR | Status: DC | PRN

## 2012-02-03 MED ORDER — SENNOSIDES-DOCUSATE SODIUM 8.6-50 MG PO TABS
2.0000 | ORAL_TABLET | Freq: Every day | ORAL | Status: DC
  Administered 2012-02-03 – 2012-02-05 (×3): 2 via ORAL

## 2012-02-03 MED ORDER — BUPIVACAINE IN DEXTROSE 0.75-8.25 % IT SOLN: INTRATHECAL | Status: DC | PRN

## 2012-02-03 MED ORDER — ZOLPIDEM TARTRATE 5 MG PO TABS
5.0000 mg | ORAL_TABLET | Freq: Every evening | ORAL | Status: DC | PRN
  Administered 2012-02-04: 5 mg via ORAL
  Filled 2012-02-03: qty 1

## 2012-02-03 MED ORDER — NALOXONE HCL 0.4 MG/ML IJ SOLN: 0.4000 mg | INTRAMUSCULAR | Status: DC | PRN

## 2012-02-03 MED ORDER — KETOROLAC TROMETHAMINE 60 MG/2ML IM SOLN
60.0000 mg | Freq: Once | INTRAMUSCULAR | Status: AC | PRN
  Administered 2012-02-03: 60 mg via INTRAMUSCULAR

## 2012-02-03 MED ORDER — EPHEDRINE 5 MG/ML INJ
INTRAVENOUS | Status: AC
  Filled 2012-02-03: qty 10

## 2012-02-03 MED ORDER — NICOTINE 14 MG/24HR TD PT24
14.0000 mg | MEDICATED_PATCH | Freq: Every day | TRANSDERMAL | Status: AC
  Administered 2012-02-03: 14 mg via TRANSDERMAL
  Filled 2012-02-03 (×3): qty 1

## 2012-02-03 MED ORDER — DIPHENHYDRAMINE HCL 50 MG/ML IJ SOLN: 12.5000 mg | Freq: Four times a day (QID) | INTRAMUSCULAR | Status: DC | PRN

## 2012-02-03 MED ORDER — DIPHENHYDRAMINE HCL 12.5 MG/5ML PO ELIX
12.5000 mg | ORAL_SOLUTION | Freq: Four times a day (QID) | ORAL | Status: DC | PRN
  Filled 2012-02-03: qty 5

## 2012-02-03 MED ORDER — DIBUCAINE 1 % RE OINT: 1.0000 "application " | TOPICAL_OINTMENT | RECTAL | Status: DC | PRN

## 2012-02-03 MED ORDER — ONDANSETRON HCL 4 MG PO TABS: 4.0000 mg | ORAL_TABLET | ORAL | Status: DC | PRN

## 2012-02-03 MED ORDER — LACTATED RINGERS IV SOLN
INTRAVENOUS | Status: DC
  Administered 2012-02-03: 15:00:00 via INTRAVENOUS

## 2012-02-03 MED ORDER — FENTANYL CITRATE 0.05 MG/ML IJ SOLN
INTRAMUSCULAR | Status: AC
  Filled 2012-02-03: qty 2

## 2012-02-03 MED ORDER — OXYTOCIN 20 UNITS IN LACTATED RINGERS INFUSION - SIMPLE
INTRAVENOUS | Status: DC | PRN
  Administered 2012-02-03: 20 [IU] via INTRAVENOUS

## 2012-02-03 MED ORDER — IBUPROFEN 600 MG PO TABS: 600.0000 mg | ORAL_TABLET | Freq: Four times a day (QID) | ORAL | Status: DC | PRN

## 2012-02-03 MED ORDER — FERROUS SULFATE 325 (65 FE) MG PO TABS
325.0000 mg | ORAL_TABLET | Freq: Two times a day (BID) | ORAL | Status: DC
  Administered 2012-02-04 – 2012-02-06 (×5): 325 mg via ORAL
  Filled 2012-02-03 (×5): qty 1

## 2012-02-03 MED ORDER — PHENYLEPHRINE 40 MCG/ML (10ML) SYRINGE FOR IV PUSH (FOR BLOOD PRESSURE SUPPORT)
PREFILLED_SYRINGE | INTRAVENOUS | Status: AC
  Filled 2012-02-03: qty 5

## 2012-02-03 MED ORDER — ONDANSETRON HCL 4 MG/2ML IJ SOLN: 4.0000 mg | Freq: Three times a day (TID) | INTRAMUSCULAR | Status: DC | PRN

## 2012-02-03 MED ORDER — SIMETHICONE 80 MG PO CHEW
80.0000 mg | CHEWABLE_TABLET | Freq: Three times a day (TID) | ORAL | Status: DC
  Administered 2012-02-03 – 2012-02-05 (×9): 80 mg via ORAL

## 2012-02-03 MED ORDER — SCOPOLAMINE 1 MG/3DAYS TD PT72
MEDICATED_PATCH | TRANSDERMAL | Status: AC
  Filled 2012-02-03: qty 1

## 2012-02-03 MED ORDER — SODIUM CHLORIDE 0.9 % IJ SOLN: 3.0000 mL | INTRAMUSCULAR | Status: DC | PRN

## 2012-02-03 MED ORDER — DEXTROSE 5 % IV SOLN
2.0000 g | INTRAVENOUS | Status: AC
  Administered 2012-02-03: 2 g via INTRAVENOUS
  Filled 2012-02-03: qty 2

## 2012-02-03 MED ORDER — SCOPOLAMINE 1 MG/3DAYS TD PT72
1.0000 | MEDICATED_PATCH | Freq: Once | TRANSDERMAL | Status: DC
  Administered 2012-02-03: 1.5 mg via TRANSDERMAL

## 2012-02-03 MED ORDER — FENTANYL CITRATE 0.05 MG/ML IJ SOLN
INTRAMUSCULAR | Status: DC | PRN
  Administered 2012-02-03: 75 ug via INTRAVENOUS
  Administered 2012-02-03: 25 ug via INTRATHECAL
  Administered 2012-02-03: 100 ug via INTRAVENOUS

## 2012-02-03 MED ORDER — PRENATAL MULTIVITAMIN CH
1.0000 | ORAL_TABLET | Freq: Every day | ORAL | Status: DC
  Administered 2012-02-03 – 2012-02-06 (×4): 1 via ORAL
  Filled 2012-02-03 (×4): qty 1

## 2012-02-03 MED ORDER — MORPHINE SULFATE 0.5 MG/ML IJ SOLN
INTRAMUSCULAR | Status: AC
  Filled 2012-02-03: qty 10

## 2012-02-03 MED ORDER — HYDROMORPHONE 0.3 MG/ML IV SOLN
INTRAVENOUS | Status: AC
  Filled 2012-02-03: qty 25

## 2012-02-03 MED ORDER — SODIUM CHLORIDE 0.9 % IV SOLN
1.0000 ug/kg/h | INTRAVENOUS | Status: DC | PRN
  Filled 2012-02-03: qty 2.5

## 2012-02-03 MED ORDER — WITCH HAZEL-GLYCERIN EX PADS: 1.0000 "application " | MEDICATED_PAD | CUTANEOUS | Status: DC | PRN

## 2012-02-03 MED ORDER — MENTHOL 3 MG MT LOZG: 1.0000 | LOZENGE | OROMUCOSAL | Status: DC | PRN

## 2012-02-03 MED ORDER — ONDANSETRON HCL 4 MG/2ML IJ SOLN: 4.0000 mg | Freq: Four times a day (QID) | INTRAMUSCULAR | Status: DC | PRN

## 2012-02-03 MED ORDER — TETANUS-DIPHTH-ACELL PERTUSSIS 5-2.5-18.5 LF-MCG/0.5 IM SUSP: 0.5000 mL | Freq: Once | INTRAMUSCULAR | Status: DC

## 2012-02-03 MED ORDER — EPHEDRINE SULFATE 50 MG/ML IJ SOLN
INTRAMUSCULAR | Status: DC | PRN
  Administered 2012-02-03: 20 mg via INTRAVENOUS

## 2012-02-03 MED ORDER — SODIUM CHLORIDE 0.9 % IJ SOLN: 9.0000 mL | INTRAMUSCULAR | Status: DC | PRN

## 2012-02-03 MED ORDER — HYDROMORPHONE 0.3 MG/ML IV SOLN
INTRAVENOUS | Status: DC
  Administered 2012-02-03: 0.9 mg via INTRAVENOUS
  Administered 2012-02-03: 10:00:00 via INTRAVENOUS
  Administered 2012-02-03: 0.599 mg via INTRAVENOUS

## 2012-02-03 MED ORDER — FENTANYL CITRATE 0.05 MG/ML IJ SOLN
25.0000 ug | INTRAMUSCULAR | Status: DC | PRN
  Administered 2012-02-03 (×4): 50 ug via INTRAVENOUS

## 2012-02-03 MED ORDER — METHYLERGONOVINE MALEATE 0.2 MG/ML IJ SOLN: 0.2000 mg | INTRAMUSCULAR | Status: DC | PRN

## 2012-02-03 MED ORDER — DIPHENHYDRAMINE HCL 25 MG PO CAPS
25.0000 mg | ORAL_CAPSULE | Freq: Four times a day (QID) | ORAL | Status: DC | PRN
  Filled 2012-02-03: qty 1

## 2012-02-03 MED ORDER — MEASLES, MUMPS & RUBELLA VAC ~~LOC~~ INJ
0.5000 mL | INJECTION | Freq: Once | SUBCUTANEOUS | Status: DC
  Filled 2012-02-03: qty 0.5

## 2012-02-03 MED ORDER — SCOPOLAMINE 1 MG/3DAYS TD PT72
1.0000 | MEDICATED_PATCH | Freq: Once | TRANSDERMAL | Status: DC
  Filled 2012-02-03: qty 1

## 2012-02-03 MED ORDER — METHYLERGONOVINE MALEATE 0.2 MG PO TABS: 0.2000 mg | ORAL_TABLET | ORAL | Status: DC | PRN

## 2012-02-03 MED ORDER — MEPERIDINE HCL 25 MG/ML IJ SOLN: 6.2500 mg | INTRAMUSCULAR | Status: DC | PRN

## 2012-02-03 MED ORDER — LANOLIN HYDROUS EX OINT: 1.0000 "application " | TOPICAL_OINTMENT | CUTANEOUS | Status: DC | PRN

## 2012-02-03 MED ORDER — ONDANSETRON HCL 4 MG/2ML IJ SOLN
INTRAMUSCULAR | Status: AC
  Filled 2012-02-03: qty 2

## 2012-02-03 MED ORDER — 0.9 % SODIUM CHLORIDE (POUR BTL) OPTIME
TOPICAL | Status: DC | PRN
  Administered 2012-02-03: 1000 mL

## 2012-02-03 SURGICAL SUPPLY — 31 items
CHLORAPREP W/TINT 26ML (MISCELLANEOUS) ×2 IMPLANT
CLOTH BEACON ORANGE TIMEOUT ST (SAFETY) ×2 IMPLANT
DRESSING TELFA 8X3 (GAUZE/BANDAGES/DRESSINGS) ×1 IMPLANT
DRSG PAD ABDOMINAL 8X10 ST (GAUZE/BANDAGES/DRESSINGS) ×1 IMPLANT
ELECT REM PT RETURN 9FT ADLT (ELECTROSURGICAL) ×2
ELECTRODE REM PT RTRN 9FT ADLT (ELECTROSURGICAL) ×1 IMPLANT
EXTRACTOR VACUUM BELL STYLE (SUCTIONS) IMPLANT
GLOVE BIO SURGEON STRL SZ7 (GLOVE) ×5 IMPLANT
GOWN PREVENTION PLUS LG XLONG (DISPOSABLE) ×6 IMPLANT
KIT ABG SYR 3ML LUER SLIP (SYRINGE) IMPLANT
NDL HYPO 25X5/8 SAFETYGLIDE (NEEDLE) ×1 IMPLANT
NEEDLE HYPO 25X5/8 SAFETYGLIDE (NEEDLE) ×2 IMPLANT
NS IRRIG 1000ML POUR BTL (IV SOLUTION) ×2 IMPLANT
PACK C SECTION WH (CUSTOM PROCEDURE TRAY) ×2 IMPLANT
RETRACTOR WND ALEXIS 25 LRG (MISCELLANEOUS) ×1 IMPLANT
RTRCTR WOUND ALEXIS 25CM LRG (MISCELLANEOUS) ×2
SLEEVE SCD COMPRESS KNEE MED (MISCELLANEOUS) IMPLANT
SPONGE GAUZE 4X4 12PLY (GAUZE/BANDAGES/DRESSINGS) ×1 IMPLANT
STAPLER VISISTAT 35W (STAPLE) IMPLANT
SUT MNCRL 0 VIOLET CTX 36 (SUTURE) ×2 IMPLANT
SUT MONOCRYL 0 CTX 36 (SUTURE) ×2
SUT PDS AB 0 CTX 60 (SUTURE) IMPLANT
SUT PLAIN 2 0 XLH (SUTURE) ×1 IMPLANT
SUT VIC AB 0 CT1 27 (SUTURE) ×4
SUT VIC AB 0 CT1 27XBRD ANBCTR (SUTURE) ×2 IMPLANT
SUT VIC AB 2-0 CT1 27 (SUTURE) ×2
SUT VIC AB 2-0 CT1 TAPERPNT 27 (SUTURE) ×1 IMPLANT
TAPE CLOTH SURG 4X10 WHT LF (GAUZE/BANDAGES/DRESSINGS) ×1 IMPLANT
TOWEL OR 17X24 6PK STRL BLUE (TOWEL DISPOSABLE) ×4 IMPLANT
TRAY FOLEY CATH 14FR (SET/KITS/TRAYS/PACK) ×2 IMPLANT
WATER STERILE IRR 1000ML POUR (IV SOLUTION) ×2 IMPLANT

## 2012-02-03 NOTE — Anesthesia Preprocedure Evaluation (Signed)
Anesthesia Evaluation  Patient identified by MRN, date of birth, ID band Patient awake    Reviewed: Allergy & Precautions, H&P , NPO status , Patient's Chart, lab work & pertinent test results  Airway Mallampati: II TM Distance: >3 FB Neck ROM: full    Dental No notable dental hx.    Pulmonary neg pulmonary ROS,    Pulmonary exam normal       Cardiovascular neg cardio ROS Normal    Neuro/Psych Negative Neurological ROS  Negative Psych ROS   GI/Hepatic negative GI ROS, Neg liver ROS,   Endo/Other    Renal/GU negative Renal ROS  Genitourinary negative   Musculoskeletal negative musculoskeletal ROS (+)   Abdominal Normal abdominal exam  (+)   Peds negative pediatric ROS (+)  Hematology negative hematology ROS (+)   Anesthesia Other Findings   Reproductive/Obstetrics (+) Pregnancy                           Anesthesia Physical Anesthesia Plan  ASA: II  Anesthesia Plan: Spinal   Post-op Pain Management:    Induction:   Airway Management Planned:   Additional Equipment:   Intra-op Plan:   Post-operative Plan:   Informed Consent: I have reviewed the patients History and Physical, chart, labs and discussed the procedure including the risks, benefits and alternatives for the proposed anesthesia with the patient or authorized representative who has indicated his/her understanding and acceptance.     Plan Discussed with: Surgeon  Anesthesia Plan Comments:         Anesthesia Quick Evaluation

## 2012-02-03 NOTE — Transfer of Care (Signed)
Immediate Anesthesia Transfer of Care Note  Patient: Charlotte Mitchell  Procedure(s) Performed: Procedure(s) (LRB): CESAREAN SECTION (N/A)  Patient Location: PACU  Anesthesia Type: Spinal  Level of Consciousness: awake, alert  and oriented  Airway & Oxygen Therapy: Patient Spontanous Breathing  Post-op Assessment: Report given to PACU RN and Post -op Vital signs reviewed and stable  Post vital signs: Reviewed and stable  Complications: No apparent anesthesia complications

## 2012-02-03 NOTE — Anesthesia Procedure Notes (Signed)
Spinal  Additional Notes Spinal Dosage in OR  Bupivicaine ml       1.3 PFMS04   mcg        150 Fentanyl mcg            25    

## 2012-02-03 NOTE — Anesthesia Postprocedure Evaluation (Signed)
Anesthesia Post Note  Patient: Charlotte Mitchell  Procedure(s) Performed: Procedure(s) (LRB): CESAREAN SECTION (N/A)  Anesthesia type: Spinal  Patient location: PACU  Post pain: Pain level controlled  Post assessment: Post-op Vital signs reviewed  Last Vitals:  Filed Vitals:   02/03/12 0915  BP:   Pulse:   Temp:   Resp: 16    Post vital signs: Reviewed  Level of consciousness: awake  Complications: No apparent anesthesia complications

## 2012-02-03 NOTE — Progress Notes (Signed)
UR chart review completed.  

## 2012-02-03 NOTE — H&P (Signed)
21 y.o.  39 wks G3P2002 comes in for a repeat cesarean section at term.  Patient has good fetal movement and no bleeding.   Past Medical History  Diagnosis Date  . No pertinent past medical history     Past Surgical History  Procedure Date  . Cesarean section   . Appendectomy   . Tonsillectomy     OB History    Grav Para Term Preterm Abortions TAB SAB Ect Mult Living   3 2 2       2      # Outc Date GA Lbr Len/2nd Wgt Sex Del Anes PTL Lv   1 TRM 12/07    F CS EPI  Yes   2 TRM 8/09    M CS EPI  Yes   3 CUR               History   Social History  . Marital Status: Single    Spouse Name: N/A    Number of Children: N/A  . Years of Education: N/A   Occupational History  . Not on file.   Social History Main Topics  . Smoking status: Current Everyday Smoker -- 0.5 packs/day for 9 years    Types: Cigarettes  . Smokeless tobacco: Never Used  . Alcohol Use: 0.6 oz/week    1 Cans of beer per week  . Drug Use: No  . Sexually Active: Yes   Other Topics Concern  . Not on file   Social History Narrative  . No narrative on file   Review of patient's allergies indicates no known allergies.   Prenatal Course: uncomplicated.  There were no vitals filed for this visit.   Lungs/Cor:  NAD Abdomen:  soft, gravid Ex:  no cords, erythema SVE:  NA FHTs:  Present.  A/P   For repeat cesarean sectionat term.  All risks, benefits and alternatives discussed with patient and she desires to proceed.  Pt is currently uncertain about tubal ligation; will consider BTL later.  Martie Muhlbauer A

## 2012-02-03 NOTE — Op Note (Addendum)
02/03/2012  8:08 AM  PATIENT:  Charlotte Mitchell  21 y.o. female  PRE-OPERATIVE DIAGNOSIS:  REPEAT c/s at term  POST-OPERATIVE DIAGNOSIS:  REPEAT  PROCEDURE:  Procedure(s) (LRB): CESAREAN SECTION (N/A)  SURGEON:  Surgeon(s) and Role:    * Loney Laurence, MD - Primary    * Miguel Aschoff, MD - Assisting  ANESTHESIA:   spinal  EBL:  Total I/O In: 2000 [I.V.:2000] Out: 775 [Urine:75; Blood:700]   SPECIMEN:  No Specimen  DISPOSITION OF SPECIMEN:  N/A  COUNTS:  YES  PLAN OF CARE: Admit to inpatient   PATIENT DISPOSITION:  PACU - hemodynamically stable.   Delay start of Pharmacological VTE agent (>24hrs) due to surgical blood loss or risk of bleeding: not applicable  Complications:  none Medications:  Ancef, Pitocin Findings:  Baby female, Apgars 9,9, weight P.   Normal tubes, ovaries and uterus seen.  Technique:  After adequate spinal anesthesia was achieved, the patient was prepped and draped in usual sterile fashion.  A foley catheter was used to drain the bladder.  A pfannanstiel incision was made with the scalpel and carried down to the fascia with the bovie cautery. The fascia was incised in the midline with the scalpel and carried in a transverse curvilinear manner bilaterally.  The fascia was reflected superiorly and inferiorly off the rectus muscles and the muscles split in the midline.  A bowel free portion of the peritoneum was entered bluntly and then extended in a superior and inferior manner with good visualization of the bowel and bladder.  The Alexis instrument was then placed and the vesico-uterine fascia tented up and incised in a transverse curvilinear manner.  A 2 cm incision was made in the upper portion of the lower uterine segment until the amnion was exposed.  Clear fluid was noted and the baby delivered in the vertex presentation without complication.  The baby was bulb suctioned and the cord was clamped and cut.  The baby was then handed to awaiting  Neonatology.  The placenta was then delivered manually and the uterus cleared of all debris.  The uterine incision was then closed with a running lock stitch of 0 monocryl.  An imbricating layer of 0 monocryl was closed as well. Good hemostasis of the uterine incision was achieved, the abdomen was cleared with irrigation and the peritoneum was closed with a running stitch of 2-0 vicryl.  This incorporated the rectus muscles as a separate layer.  The fascia was then closed with a running stitch of 0 vicryl.  The subcutaneous layer was closed with interrupted  stitches of 2-0 plain gut.  The skin was closed with staples.  The patient tolerated the procedure well and was returned to the recovery room in stable condition.  All counts were correct times three.  Lenola Lockner A

## 2012-02-03 NOTE — Brief Op Note (Addendum)
02/03/2012  8:08 AM  PATIENT:  Charlotte Mitchell  20 y.o. female  PRE-OPERATIVE DIAGNOSIS:  REPEAT c/s at term  POST-OPERATIVE DIAGNOSIS:  REPEAT  PROCEDURE:  Procedure(s) (LRB): CESAREAN SECTION (N/A)  SURGEON:  Surgeon(s) and Role:    * Tashay Bozich A Lantz Hermann, MD - Primary    * Allan Ross, MD - Assisting  ANESTHESIA:   spinal  EBL:  Total I/O In: 2000 [I.V.:2000] Out: 775 [Urine:75; Blood:700]   SPECIMEN:  No Specimen  DISPOSITION OF SPECIMEN:  N/A  COUNTS:  YES  PLAN OF CARE: Admit to inpatient   PATIENT DISPOSITION:  PACU - hemodynamically stable.   Delay start of Pharmacological VTE agent (>24hrs) due to surgical blood loss or risk of bleeding: not applicable  Complications:  none Medications:  Ancef, Pitocin Findings:  Baby female, Apgars 9,9, weight P.   Normal tubes, ovaries and uterus seen.  Technique:  After adequate spinal anesthesia was achieved, the patient was prepped and draped in usual sterile fashion.  A foley catheter was used to drain the bladder.  A pfannanstiel incision was made with the scalpel and carried down to the fascia with the bovie cautery. The fascia was incised in the midline with the scalpel and carried in a transverse curvilinear manner bilaterally.  The fascia was reflected superiorly and inferiorly off the rectus muscles and the muscles split in the midline.  A bowel free portion of the peritoneum was entered bluntly and then extended in a superior and inferior manner with good visualization of the bowel and bladder.  The Alexis instrument was then placed and the vesico-uterine fascia tented up and incised in a transverse curvilinear manner.  A 2 cm incision was made in the upper portion of the lower uterine segment until the amnion was exposed.  Clear fluid was noted and the baby delivered in the vertex presentation without complication.  The baby was bulb suctioned and the cord was clamped and cut.  The baby was then handed to awaiting  Neonatology.  The placenta was then delivered manually and the uterus cleared of all debris.  The uterine incision was then closed with a running lock stitch of 0 monocryl.  An imbricating layer of 0 monocryl was closed as well. Good hemostasis of the uterine incision was achieved, the abdomen was cleared with irrigation and the peritoneum was closed with a running stitch of 2-0 vicryl.  This incorporated the rectus muscles as a separate layer.  The fascia was then closed with a running stitch of 0 vicryl.  The subcutaneous layer was closed with interrupted  stitches of 2-0 plain gut.  The skin was closed with staples.  The patient tolerated the procedure well and was returned to the recovery room in stable condition.  All counts were correct times three.  [name] A     

## 2012-02-04 ENCOUNTER — Encounter (HOSPITAL_COMMUNITY): Payer: Self-pay | Admitting: Obstetrics and Gynecology

## 2012-02-04 LAB — CBC
Hemoglobin: 9.8 g/dL — ABNORMAL LOW (ref 12.0–15.0)
MCH: 33.4 pg (ref 26.0–34.0)
MCV: 101.4 fL — ABNORMAL HIGH (ref 78.0–100.0)
Platelets: 120 10*3/uL — ABNORMAL LOW (ref 150–400)
RBC: 2.93 MIL/uL — ABNORMAL LOW (ref 3.87–5.11)
WBC: 10.8 10*3/uL — ABNORMAL HIGH (ref 4.0–10.5)

## 2012-02-04 NOTE — Anesthesia Postprocedure Evaluation (Signed)
  Anesthesia Post-op Note  Patient: Charlotte Mitchell  Procedure(s) Performed: Procedure(s) (LRB): CESAREAN SECTION (N/A)  Patient Location: PACU and Mother/Baby  Anesthesia Type: Spinal  Level of Consciousness: awake, alert  and oriented  Airway and Oxygen Therapy: Patient Spontanous Breathing  Post-op Pain: none  Post-op Assessment: Post-op Vital signs reviewed, Patient's Cardiovascular Status Stable, No headache, No backache, No residual numbness and No residual motor weakness  Post-op Vital Signs: Reviewed and stable  Complications: No apparent anesthesia complications

## 2012-02-04 NOTE — Addendum Note (Signed)
Addendum  created 02/04/12 0810 by Madison Hickman, CRNA   Modules edited:Notes Section

## 2012-02-04 NOTE — Progress Notes (Signed)
Patient ID: Charlotte Mitchell, female   DOB: [birth year], 20 y.o.   MRN: [id number]   POD#1 s/p Repeat C/S  S: Doing well, pain controlled, voiding well since catheter d/cd O:  Filed Vitals:   02/03/12 1825 02/03/12 1950 02/04/12 0005 02/04/12 0505  BP: 108/60 106/69 104/78 111/78  Pulse: 69 83 82 76  Temp: 99.2 F (37.3 C) 98 F (36.7 C) 98 F (36.7 C) 97.9 F (36.6 C)  TempSrc: Oral Oral Oral Oral  Resp: 16 18 18 18   Height:      Weight:      SpO2: 98% 98% 98% 99%   Aox3, NAD Abd soft, appropr tender, ND Dressing C/D/I  CBC    Component Value Date/Time   WBC 10.8* 02/04/2012 0546   RBC 2.93* 02/04/2012 0546   HGB 9.8* 02/04/2012 0546   HCT 29.7* 02/04/2012 0546   PLT 120* 02/04/2012 0546   MCV 101.4* 02/04/2012 0546   MCH 33.4 02/04/2012 0546   MCHC 33.0 02/04/2012 0546   RDW 13.1 02/04/2012 0546   LYMPHSABS 3.0 10/12/2010 2005   MONOABS 0.8 10/12/2010 2005   EOSABS 0.2 10/12/2010 2005   BASOSABS 0.0 10/12/2010 2005    A/P 1) Routine PO care 2) Encourage ambulation 3) Bottle feeding

## 2012-02-05 MED ORDER — SALINE SPRAY 0.65 % NA SOLN
1.0000 | NASAL | Status: DC | PRN
  Administered 2012-02-05: 1 via NASAL
  Filled 2012-02-05: qty 44

## 2012-02-05 NOTE — Progress Notes (Signed)
Subjective: Postpartum Day 2: Cesarean Delivery Patient reports incisional pain, tolerating PO, + flatus, + BM and no problems voiding.    Objective: Vital signs in last 24 hours: Filed Vitals:   02/04/12 0900 02/04/12 1404 02/04/12 2125 02/05/12 0543  BP: 122/73 110/68 104/67 108/70  Pulse: 72 67 73 70  Temp: 97.6 F (36.4 C) 98.7 F (37.1 C) 97.9 F (36.6 C) 97.4 F (36.3 C)  TempSrc: Oral Oral Oral Oral  Resp: 18 18 18 18   Height:      Weight:      SpO2: 99%       Physical Exam:  General: alert, cooperative and appears stated age 21: appropriate Uterine Fundus: firm Incision: healing well DVT Evaluation: No evidence of DVT seen on physical exam.   Basename 02/04/12 0546  HGB 9.8*  HCT 29.7*    Assessment/Plan: Status post Cesarean section. Doing well postoperatively.  Breastfeeding Continue current care. Considering early D/C but still undecided  Niva Murren H. 02/05/2012, 12:03 PM

## 2012-02-05 NOTE — Discharge Summary (Signed)
Obstetric Discharge Summary Reason for Admission: cesarean section Prenatal Procedures: ultrasound Intrapartum Procedures: cesarean: low cervical, transverse Postpartum Procedures: none Complications-Operative and Postpartum: none Hemoglobin  Date Value Range Status  02/04/2012 9.8* 12.0-15.0 (g/dL) Final     HCT  Date Value Range Status  02/04/2012 29.7* 36.0-46.0 (%) Final    Discharge Diagnoses: Term Pregnancy-delivered  Discharge Information: Date: 02/05/2012 Activity: pelvic rest Diet: routine Medications: Ibuprofen and Percocet Condition: stable Instructions: refer to practice specific booklet Discharge to: home   Newborn Data: Live born female  Birth Weight: 6 lb 12.6 oz (3080 g) APGAR: 9, 9  Home with mother.  Melanee Cordial H. 02/05/2012, 9:39 AM

## 2012-02-06 MED ORDER — SALINE NASAL SPRAY 0.65 % NA SOLN: 1.0000 | NASAL | Status: DC | PRN

## 2012-02-06 MED ORDER — HYDROCORTISONE 1 % EX OINT: TOPICAL_OINTMENT | Freq: Two times a day (BID) | CUTANEOUS | Status: AC

## 2012-02-06 MED ORDER — OXYCODONE-ACETAMINOPHEN 5-325 MG PO TABS: 2.0000 | ORAL_TABLET | ORAL | Status: AC | PRN

## 2014-09-30 ENCOUNTER — Encounter (HOSPITAL_COMMUNITY): Payer: Self-pay | Admitting: Emergency Medicine

## 2014-09-30 ENCOUNTER — Emergency Department (HOSPITAL_COMMUNITY): Payer: Medicaid Other

## 2014-09-30 ENCOUNTER — Emergency Department (HOSPITAL_COMMUNITY)
Admission: EM | Admit: 2014-09-30 | Discharge: 2014-09-30 | Disposition: A | Discharge status: Home / Self Care | Payer: Medicaid Other | Attending: Emergency Medicine | Admitting: Emergency Medicine

## 2014-09-30 DIAGNOSIS — Y9289 Other specified places as the place of occurrence of the external cause: Secondary | ICD-10-CM | POA: Diagnosis not present

## 2014-09-30 DIAGNOSIS — Z720 Tobacco use: Secondary | ICD-10-CM | POA: Insufficient documentation

## 2014-09-30 DIAGNOSIS — S91342A Puncture wound with foreign body, left foot, initial encounter: Secondary | ICD-10-CM | POA: Insufficient documentation

## 2014-09-30 DIAGNOSIS — Z79899 Other long term (current) drug therapy: Secondary | ICD-10-CM | POA: Diagnosis not present

## 2014-09-30 DIAGNOSIS — S90852A Superficial foreign body, left foot, initial encounter: Secondary | ICD-10-CM

## 2014-09-30 DIAGNOSIS — Y9389 Activity, other specified: Secondary | ICD-10-CM | POA: Diagnosis not present

## 2014-09-30 DIAGNOSIS — M795 Residual foreign body in soft tissue: Secondary | ICD-10-CM

## 2014-09-30 DIAGNOSIS — Z23 Encounter for immunization: Secondary | ICD-10-CM | POA: Diagnosis not present

## 2014-09-30 DIAGNOSIS — W450XXA Nail entering through skin, initial encounter: Secondary | ICD-10-CM | POA: Diagnosis not present

## 2014-09-30 MED ORDER — OXYCODONE-ACETAMINOPHEN 5-325 MG PO TABS
2.0000 | ORAL_TABLET | Freq: Once | ORAL | Status: AC
  Administered 2014-09-30: 2 via ORAL
  Filled 2014-09-30: qty 2

## 2014-09-30 MED ORDER — TETANUS-DIPHTH-ACELL PERTUSSIS 5-2.5-18.5 LF-MCG/0.5 IM SUSP
0.5000 mL | Freq: Once | INTRAMUSCULAR | Status: AC
  Administered 2014-09-30: 0.5 mL via INTRAMUSCULAR
  Filled 2014-09-30: qty 0.5

## 2014-09-30 MED ORDER — CIPROFLOXACIN HCL 500 MG PO TABS: 500.0000 mg | ORAL_TABLET | Freq: Two times a day (BID) | ORAL | Status: DC

## 2014-09-30 NOTE — ED Provider Notes (Signed)
CSN: [id number]     Arrival date & time 09/30/14  1403 History   This chart was scribed for Oswaldo ConroyVictoria Yolinda Duerr, PA-C working with Arby BarretteMarcy Pfeiffer, MD by Evon Slackerrance Branch, ED Scribe. This patient was seen in room TR08C/TR08C and the patient's care was started at 4:33 PM.     Chief Complaint  Patient presents with  . Foreign Body in Skin   The history is provided by the patient. No language interpreter was used.   HPI Comments: Charlotte Mitchell is a 23 y.o. female who presents to the Emergency Department complaining of left foot injury onset 4 hours prior. Describes the pain as sharp and throbbing. She states that she stepped on a nail while wearing no shoes. She states that the nail is about 3 inches long. Patient endorses mild erythema surrounding area without edema. Denies fever, nausea, vomiting. She states that she is unsure if her tetanus is up to date.      Past Medical History  Diagnosis Date  . No pertinent past medical history    Past Surgical History  Procedure Laterality Date  . Cesarean section    . Appendectomy    . Tonsillectomy    . Cesarean section  02/03/2012    Procedure: CESAREAN SECTION;  Surgeon: Loney LaurenceMichelle A Horvath, MD;  Location: [facility];  Service: Gynecology;  Laterality: N/A;   History reviewed. No pertinent family history. History  Substance Use Topics  . Smoking status: Current Every Day Smoker -- 0.50 packs/day for 9 years    Types: Cigarettes  . Smokeless tobacco: Never Used  . Alcohol Use: No   OB History   Grav Para Term Preterm Abortions TAB SAB Ect Mult Living   3 3 3       3      Review of Systems  Constitutional: Negative for fever and chills.  Gastrointestinal: Negative for nausea and vomiting.  Skin: Positive for wound.  All other systems reviewed and are negative.     Allergies  Hydrocodone  Home Medications   Prior to Admission medications   Medication Sig Start Date End Date Taking? Authorizing Provider  prenatal vitamin w/FE, FA  (PRENATAL 1 + 1) 27-1 MG TABS Take 1 tablet by mouth daily.      Historical Provider, MD  sodium chloride (OCEAN NASAL SPRAY) 0.65 % nasal spray Place 1 spray into the nose as needed for congestion. 02/06/12 02/05/13  Kendra H. Tenny Crawoss, MD   Triage Vitals: BP 110/73  Pulse 93  Temp(Src) 98.4 F (36.9 C) (Oral)  Resp 24  Ht 5\' 4"  (1.626 m)  Wt 160 lb (72.576 kg)  BMI 27.45 kg/m2  SpO2 100%  Physical Exam  Nursing note and vitals reviewed. Constitutional: She appears well-developed and well-nourished. No distress.  HENT:  Head: Normocephalic and atraumatic.  Eyes: Conjunctivae and EOM are normal. Right eye exhibits no discharge. Left eye exhibits no discharge.  Cardiovascular: Normal rate, regular rhythm and normal heart sounds.   Pulmonary/Chest: Effort normal and breath sounds normal. No respiratory distress. She has no wheezes.  Abdominal: Soft. Bowel sounds are normal. She exhibits no distension. There is no tenderness.  Musculoskeletal: She exhibits tenderness.  Left Foot: 2-3cm nail inside left foot on medial sole of foot near first metatarsal, mild erythema, no edema or swelling, Less than 3 sec cap refill, 2+ distal pulses equal bilaterally, full ROM of ankle , no tenderness in leg  Neurological: She is alert. She exhibits normal muscle tone. Coordination normal.  Skin:  Skin is warm and dry. She is not diaphoretic.    ED Course  Procedures (including critical care time) DIAGNOSTIC STUDIES: Oxygen Saturation is 100% on RA, normal by my interpretation.    COORDINATION OF CARE: 5:25 PM-Discussed treatment plan which includes consult with attending with pt at bedside and pt agreed to plan.     Labs Review Labs Reviewed - No data to display  Imaging Review Dg Foot Complete Left  09/30/2014   CLINICAL DATA:  Foreign body in soft tissue, stepped on nail  EXAM: LEFT FOOT - COMPLETE 3+ VIEW  COMPARISON:  None.  FINDINGS: There is a nail imbedded in the soft tissues of the medial  aspect of the foot at the level of the mid distal left first metatarsal. No fracture is seen. No malalignment is noted. Tarsal gas metatarsal alignment is normal. Joint spaces appear normal.  IMPRESSION: Foreign body nail in the soft tissues of the medial aspects left foot. No bony involvement.   Electronically Signed   By: Dwyane DeePaul  Barry M.D.   On: 09/30/2014 17:10     EKG Interpretation None     Meds given in ED:  Medications  oxyCODONE-acetaminophen (PERCOCET/ROXICET) 5-325 MG per tablet 2 tablet (2 tablets Oral Given 09/30/14 1737)  Tdap (BOOSTRIX) injection 0.5 mL (0.5 mLs Intramuscular Given 09/30/14 1746)    Discharge Medication List as of 09/30/2014  6:05 PM    START taking these medications   Details  ciprofloxacin (CIPRO) 500 MG tablet Take 1 tablet (500 mg total) by mouth 2 (two) times daily., Starting 09/30/2014, Until Discontinued, Print          MDM   Final diagnoses:  Foreign body (FB) in soft tissue   Patient with nail in left medial sole of foot under first metatarsal. Pain controled in ED. VSS. X-ray without signs of bone involvement. Case discussed with Dr. Clarice PolePfeifer who quickly removed nail from foot. Immediate bleeding that was easily stanched. Puncture wound thoroughly irrigated. Local antibiotic ointment applied and sterile dressing. Tetanus updated today. Treat with Cipro to cover possible pseudomonas infection prophylactically. Treat pain with NSAIDs.  Discussed return precautions with patient. Discussed all results and patient verbalizes understanding and agrees with plan.  This is a shared patient. This patient was discussed with the physician who saw and evaluated the patient and agrees with the plan.  I personally performed the services described in this documentation, which was scribed in my presence. The recorded information has been reviewed and is accurate.     Charlotte SjogrenVictoria L Lessie Funderburke, PA-C 10/01/14 [phone]

## 2014-09-30 NOTE — ED Notes (Signed)
Pt reports stepped on nail about an hour ago; nail still in place sticking out of bottom of L foot

## 2014-09-30 NOTE — Discharge Instructions (Signed)
Please take all of your antibiotics until finished!   You may develop abdominal discomfort or diarrhea from the antibiotic.  You may help offset this with probiotics which you can buy or get in yogurt. Do not eat  or take the probiotics until 2 hours after your antibiotic.  Ibuprofen 400mg  (2 tablets 200mg ) every 5-6 hours for 3-5 days and then as needed for pain. Follow up with PCP, urgent care or ED if symptoms worsen or signs of infection see below  Keep wound dry and do not remove dressing for 24 hours if possible. After that, wash gently morning and night (every 12 hours) with soap and water. Use a topical antibiotic ointment and cover with a bandaid or gauze.    Do NOT use rubbing alcohol or hydrogen peroxide, do not soak the area   Every attempt was made to remove foreign body (contaminants) from the wound.  However, there is always a chance that some may remain in the wound. This can  increase your risk of infection.   If you see signs of infection (warmth, redness, tenderness, pus, sharp increase in pain, fever, red streaking in the skin) immediately return to the emergency department.    Emergency Department Resource Guide 1) Find a Doctor and Pay Out of Pocket Although you won't have to find out who is covered by your insurance plan, it is a good idea to ask around and get recommendations. You will then need to call the office and see if the doctor you have chosen will accept you as a new patient and what types of options they offer for patients who are self-pay. Some doctors offer discounts or will set up payment plans for their patients who do not have insurance, but you will need to ask so you aren't surprised when you get to your appointment.  2) Contact Your Local Health Department Not all health departments have doctors that can see patients for sick visits, but many do, so it is worth a call to see if yours does. If you don't know where your local health department is, you can  check in your phone book. The CDC also has a tool to help you locate your state's health department, and many state websites also have listings of all of their local health departments.  3) Find a Walk-in Clinic If your illness is not likely to be very severe or complicated, you may want to try a walk in clinic. These are popping up all over the country in pharmacies, drugstores, and shopping centers. They're usually staffed by nurse practitioners or physician assistants that have been trained to treat common illnesses and complaints. They're usually fairly quick and inexpensive. However, if you have serious medical issues or chronic medical problems, these are probably not your best option.  No Primary Care Doctor: - Call Health Connect at  [phone] - they can help you locate a primary care doctor that  accepts your insurance, provides certain services, etc. - Physician Referral Service- [phone]  Chronic Pain Problems: Organization         Address  Phone   Notes  [facility]  [phone] Patients need to be referred by their primary care doctor.   Medication Assistance: Organization         Address  Phone   Notes  [facility] [facility] [address] [place], [place] [address] [phone] --Must be a resident of [place] [facility] -- Must have NO  insurance coverage whatsoever (no Medicaid/ Medicare, etc.) -- The pt. MUST have a primary care doctor that directs their care regularly and follows them in the community   [facility]ist  [phone]   [employer]  [phone]    Agencies that provide inexpensive medical care: Organization         Address  Phone   Notes  [facility]  [phone]   [facility]    [phone]   [facility] [address] [place], [place] [address] [phone]   [facility] [address], [place] [phone]    Planned Parenthood    [phone]) [phone]   [facility]    [phone]   Community Health and [facility]  [address], [place] Phone:  [phone], Fax:  [phone] Hours of Operation:  9 am - 6 pm, M-F.  Also accepts Medicaid/Medicare and self-pay.  [facility]  [address], [place] Phone: [phone]) [phone], Fax: [phone]. Hours of Operation:  8:30 am - 5:30 pm, M-F.  Also accepts Medicaid and self-pay.  [facility] [place] [address], [place] Phone: [phone]   [facility] [address], [place], [place] [phone], Ext. [phone] Mondays & Thursdays: 7-9 AM.  First 15 patients are seen on a first come, first serve basis.    Medicaid-accepting [facility] Providers:  Organization         Address  Phone   Notes  [facility] [address], [place] [phone] Also accepts self-pay patients.  [facility] [address], [place]  [phone]   [facility] [address], [place] [phone]   [facility] [address] [place] [address], [place] [phone]) [phone]   Renaye RakersVeita Bland [address], [place]   [phone] Only accepts [place] Access [place] patients after they have their name applied to their card.   Self-Pay (no insurance) in [facility]:  Organization         Address  Phone   Notes  Sickle Cell Patients, [facility] [address], [place] [phone]) [phone]   [facility] [address], [place] [phone]   [facility] [place]  [address],  [phone]   [facility]/Dr. Osei-Bonsu  [address] [place] [address], [place] or [phone], [place] [phone] Phone number for both [place] and [place] locations is the  same.  [facility]gent [facility] and [facility] [address], [place] [phone]   [facility] [place] [address] [place] [address], [place] or [address] [phone]   [facility] [address], [place] [phone], phone; [phone], fax Sees patients 1st and 3rd Saturday of every month.  Must not qualify for public or private insurance (i.e. Medicaid, Medicare, [place] Health Choice, Veterans' Benefits)  Household income should be no more than 200% of the poverty level The clinic cannot treat you if you are pregnant or think you are pregnant  Sexually transmitted diseases are not treated at the clinic.    Dental Care: Organization         Address  Phone  Notes  [facility] [address] [place], [place] [phone] Accepts children up to age 100 who are enrolled in [place] or [place] Health Choice; pregnant women with a Medicaid card; and children who have applied for Medicaid or [facility] Health Choice, but were declined, whose parents can pay a reduced fee at time of service.  [facility] Department of [facility]  [address], [place] [phone] Accepts children up to age 35 who are enrolled in [place] or [place] Health Choice; pregnant women with a Medicaid card; and children who have applied for Medicaid or  Health Choice, but were declined, whose parents can pay a reduced fee at time of service.  [facility]  [address] [place], [place] [phone] Patients are seen by appointment only. Walk-ins are not accepted. [facility] will see patients 54 years of age and older. Monday - Tuesday (8am-5pm) Most Wednesdays (8:30-5pm) $30 per visit, cash only  [facility] Adult Dental Access PROGRAM  [address], [facility] [phone] Patients are seen by appointment only. Walk-ins are not accepted. [facility] will see patients  35 years of age and older. One Wednesday Evening (Monthly: Volunteer Based).  $30 per visit, cash only  [employer]  [phone] for adults; Children under age 30, call Graduate Pediatric Dentistry at [phone]. Children aged 61-14, please call [phone] to request a pediatric application.  Dental services are provided in all areas of dental care including fillings, crowns and bridges, complete and partial dentures, implants, gum treatment, root canals, and extractions. Preventive care is also provided. Treatment is provided to both adults and children. Patients are selected via a lottery and there is often a waiting list.   [facility] [address] [place]  [phone] [email or web address]   [facility] [address] [place], [place] [phone], Ext. [phone] Second and Fourth Thursday of each month, opens at 6:30 AM; Clinic ends at 9 AM.  Patients are seen on a first-come first-served basis, and a limited number are seen during each clinic.   [facility]  [address] [place]ins [place], [place] [phone]   Eligibility Requirements You must have lived in [place], [place], or [place] counties for at least the last three months.   You cannot be eligible for state or federal sponsored [place], including [employer], [place], or [employer].   You generally cannot be eligible for healthcare insurance through your employer.    How to apply: Eligibility screenings are held every Tuesday and Wednesday afternoon from 1:00 pm until 4:00 pm. You do not need an appointment for the interview!  [facility] [address], [place], [place] [phone]   [facility] Health Department  [phone]   [facility] Health Department  [phone]   [facility] Health Department  [phone]    Behavioral Health Resources in the Community: Intensive Outpatient  Programs Organization         Address  Phone  Notes  [facility] [address] [place], [place] [phone]   [facility] Outpatient [address], [place], [place] [phone]   [facility] [address], [place], [place]  [phone]   [facility] [address]  [place], [place] [phone] or [phone]   Substance Abuse Resources Organization  Address  Phone  Notes  [facility] Drug Services  [phone]   [facility]  [phone]   [facility] [place]  [phone]   Floydene Flock  [phone]   Residential & Outpatient Substance Abuse Program  [phone]   Psychological Services Organization         Address  Phone  Notes  [facility] Health  [phone]   [facility] Services  [phone]   [facility] Mental [facility] [address], [place] [phone] or [phone]    Mobile Crisis Teams Organization         Address  Phone  Notes  [facility], Mobile Crisis Care Unit  [phone]   [facility]  [address] [place], [place] [phone]   Doristine Locks [address] [place] [phone]    Self-Help/Support Groups Organization         Address  Phone             Notes  [facility] [place] - variety of support groups  [phone]- I7437963 Call for more information  Narcotics Anonymous (NA), [facility] [address], [employer] [place]  2 meetings at this location   Statistician         Address  Phone  Notes  [facility] [address],    [place]  [phone]   [facility]  [address] [place] [address], [place], [place] [phone]   [facility] [address] [place], [place] [phone] Admissions: 8am-3pm M-F  [facility] [address]    [place], [place]  [phone]   [facility] [address] [place], [place], [place] [phone]   [facility] [address],  [place], [place] [phone]   [facility] - Intensive Outpatient [address], [place], [place] [phone]   [facility] ([facility] [address]  [place], [place] [phone] or [phone]   [facility] ([facility]) [address], [place], [place] [phone] Accepts Medicaid  [facility] [place] [address]  [place] [phone] Substance Abuse/Addiction Treatment   [facility] Organization         Address  Phone  Notes  [facility]  [phone]   Angie Fava, PhD [address] [place], [place]   [phone] or [phone]   [facility]   [address] [place], [place] [phone]   [facility] [address] [place], [place] [phone] Insurance/Medicaid/sponsorship through [facility] and Families [address]                                    [place], [place] [phone] Therapy/tele-psych/case  [facility] [address], [place] [phone]    Dr. Lolly Mustache  [phone]   [facility]  [employer] [facility] Dept. 1) [address] [place] 2) [address], [place] 3)  [address], [place] [phone]  [phone]   [facility] Child Abuse Hotline (252) 567-0836 or [phone] (After Hours)

## 2014-10-01 NOTE — ED Provider Notes (Signed)
Medical screening examination/treatment/procedure(s) were conducted as a shared visit with non-physician practitioner(s) and myself.  I personally evaluated the patient during the encounter.   EKG Interpretation None     I evaluated the patient for puncture wound having stepped on a nail. A similar nail was brought in. There are no phalanges on it.  I have reviewed the x-ray and there is no evidence of intrusion into the bone. It is lateral in the soft tissues.  Under direct examination of the nail was protruding from the medial plantar surface of the foot. With quick steady traction that was removed without any difficulty. Orders were subsequently given for irrigation and cleaning of the wound and antibiotic therapy. The patient be started on ciprofloxacin  Arby BarretteMarcy Keng Jewel, MD 10/01/14 [phone]

## 2014-10-07 ENCOUNTER — Encounter (HOSPITAL_COMMUNITY): Payer: Self-pay | Admitting: Emergency Medicine

## 2014-12-06 DIAGNOSIS — R569 Unspecified convulsions: Secondary | ICD-10-CM

## 2014-12-06 HISTORY — DX: Unspecified convulsions: R56.9

## 2015-11-09 IMAGING — CR DG FOOT COMPLETE 3+V*L*
3 series · 3 of 3 positions shown · non-contrast
Comparison: None.

CLINICAL DATA: Foreign body in soft tissue, stepped on nail

EXAM:
LEFT FOOT - COMPLETE 3+ VIEW

[t foot ap left]
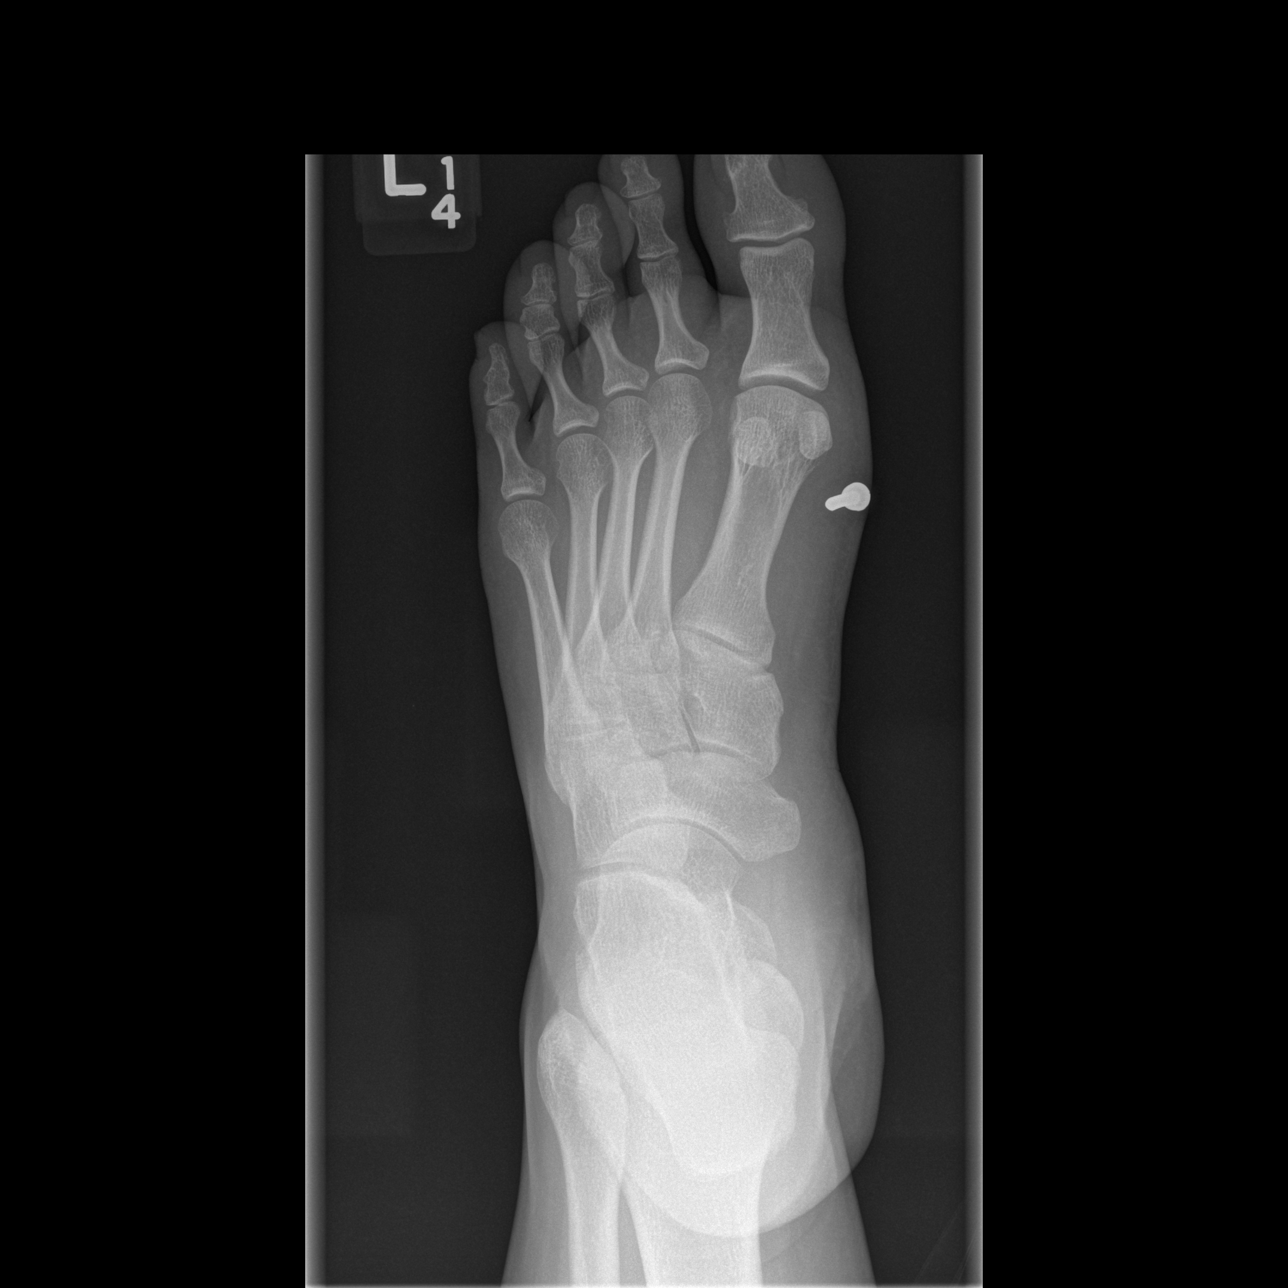

[t foot oblique left]
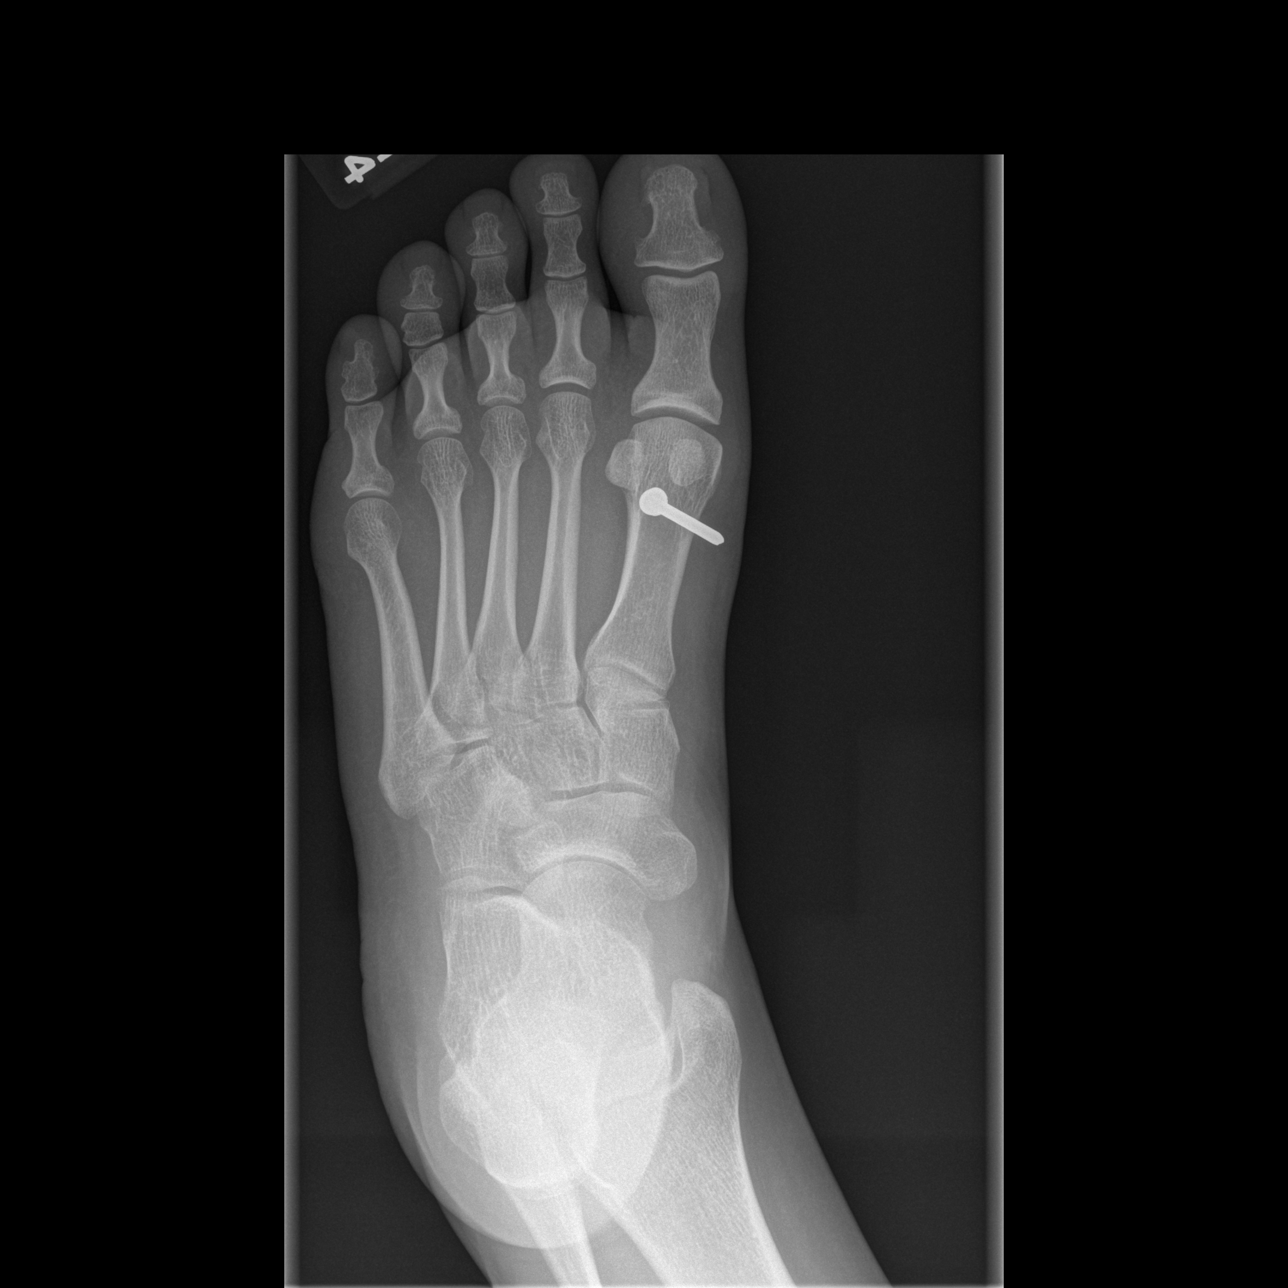

[t foot lat left]
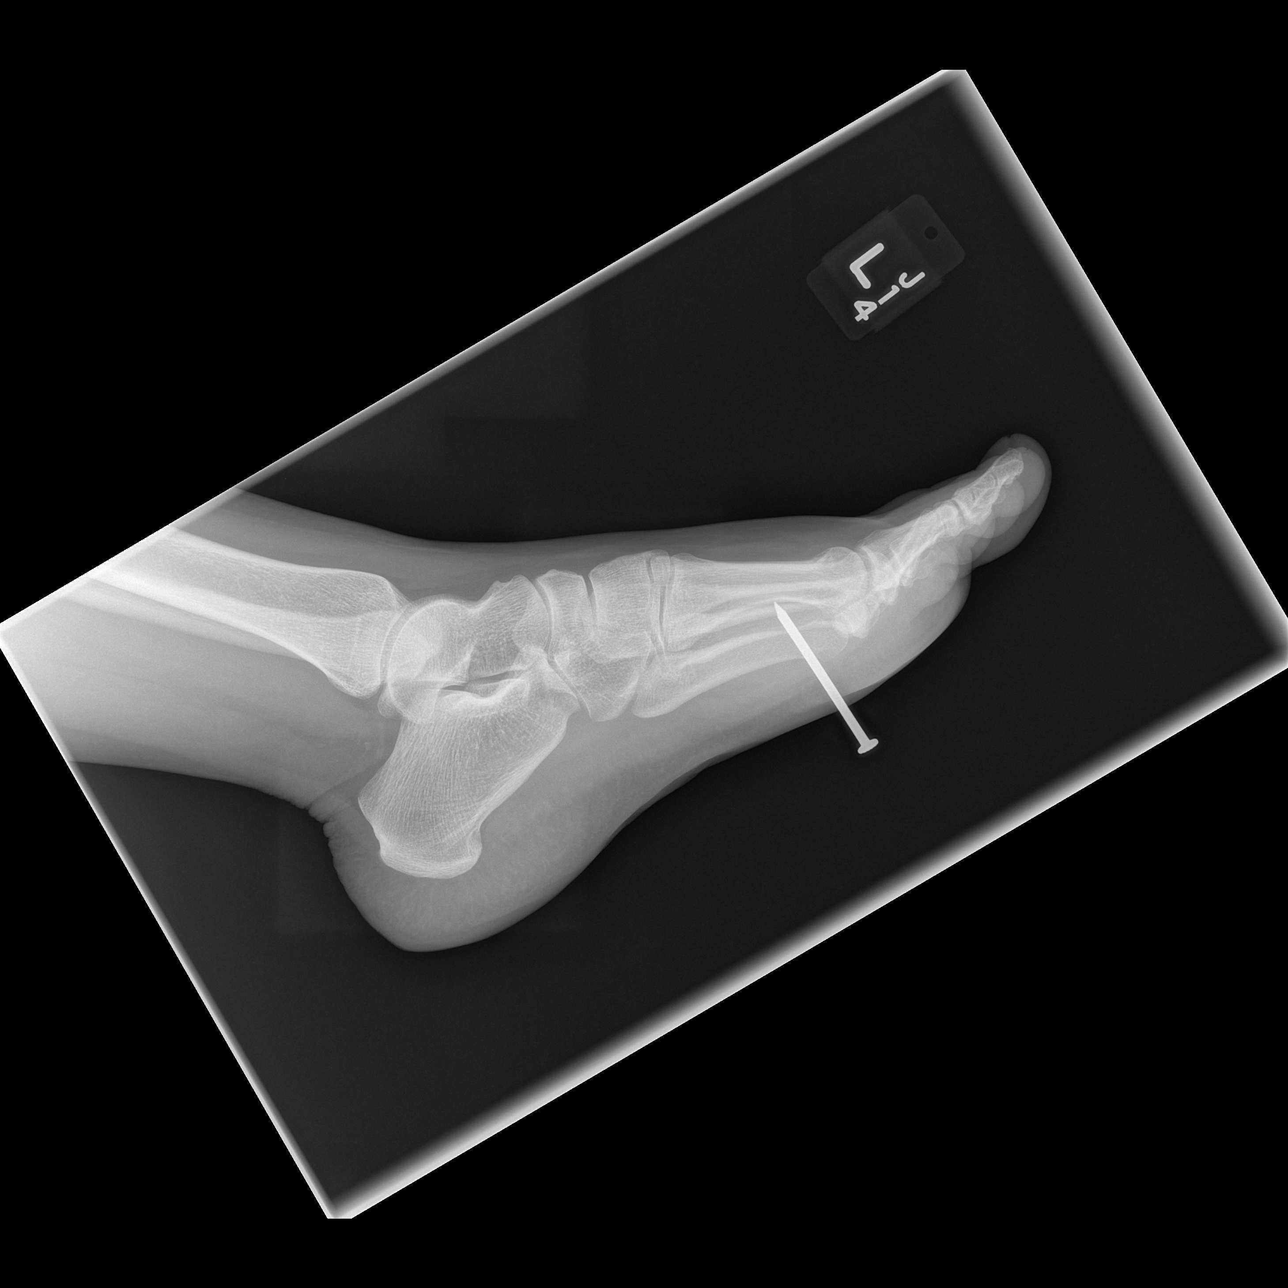

[3 of 3 positions shown; findings below may reference images not displayed]

FINDINGS: There is a nail imbedded in the soft tissues of the medial aspect of
the foot at the level of the mid distal left first metatarsal. No
fracture is seen. No malalignment is noted. Tarsal gas metatarsal
alignment is normal. Joint spaces appear normal.
IMPRESSION: Foreign body nail in the soft tissues of the medial aspects left
foot. No bony involvement.

## 2015-12-27 ENCOUNTER — Emergency Department (HOSPITAL_COMMUNITY)
Admission: EM | Admit: 2015-12-27 | Discharge: 2015-12-27 | Disposition: A | Discharge status: Home / Self Care | Payer: Medicaid Other | Attending: Emergency Medicine | Admitting: Emergency Medicine

## 2015-12-27 ENCOUNTER — Encounter (HOSPITAL_COMMUNITY): Payer: Self-pay | Admitting: *Deleted

## 2015-12-27 DIAGNOSIS — Z79899 Other long term (current) drug therapy: Secondary | ICD-10-CM | POA: Diagnosis not present

## 2015-12-27 DIAGNOSIS — F17210 Nicotine dependence, cigarettes, uncomplicated: Secondary | ICD-10-CM | POA: Insufficient documentation

## 2015-12-27 DIAGNOSIS — F419 Anxiety disorder, unspecified: Secondary | ICD-10-CM | POA: Diagnosis present

## 2015-12-27 DIAGNOSIS — M542 Cervicalgia: Secondary | ICD-10-CM | POA: Insufficient documentation

## 2015-12-27 DIAGNOSIS — R451 Restlessness and agitation: Secondary | ICD-10-CM | POA: Diagnosis not present

## 2015-12-27 DIAGNOSIS — R110 Nausea: Secondary | ICD-10-CM | POA: Insufficient documentation

## 2015-12-27 DIAGNOSIS — F1123 Opioid dependence with withdrawal: Secondary | ICD-10-CM | POA: Diagnosis not present

## 2015-12-27 DIAGNOSIS — F1193 Opioid use, unspecified with withdrawal: Secondary | ICD-10-CM

## 2015-12-27 HISTORY — DX: Other psychoactive substance abuse, uncomplicated: F19.10

## 2015-12-27 MED ORDER — HYDROXYZINE HCL 50 MG/ML IM SOLN
50.0000 mg | Freq: Four times a day (QID) | INTRAMUSCULAR | Status: DC | PRN
  Administered 2015-12-27: 50 mg via INTRAMUSCULAR
  Filled 2015-12-27: qty 1

## 2015-12-27 MED ORDER — LORAZEPAM 1 MG PO TABS
2.0000 mg | ORAL_TABLET | Freq: Once | ORAL | Status: AC
  Administered 2015-12-27: 2 mg via ORAL
  Filled 2015-12-27: qty 2

## 2015-12-27 NOTE — ED Notes (Signed)
Bed: WA20 Expected date:  Expected time:  Means of arrival:  Comments: EMS 40F itching/uncomfortable

## 2015-12-27 NOTE — ED Notes (Signed)
Pt came out of bathroom 3 stating "I feel she fell in the bathroom, I keep passing out today." All these, as she independently walks back to her assigned exam 20. I have notified assigned nurse.

## 2015-12-27 NOTE — ED Notes (Signed)
Dr. Effie Shy to pt bedside at this time to assess the pt, aware that the pt had unwitnessed fall in the restroom that she reported to another nurse. No further orders at this time.

## 2015-12-27 NOTE — ED Notes (Signed)
Pt states that she last took her methadone  yesterday morning (has been on methadone for 8 months). She went this morning to pick up her methadone and they would not let her in because she was 5 minutes late. Pt says that she feels hot all over, nauseated, and hurting all over. She says she does not know why her boyfriend called EMS. Denies SI/HI.

## 2015-12-27 NOTE — Discharge Instructions (Signed)
Make sure you go over your methadone dosing, in the morning.   Opioid Withdrawal Opioids are a group of narcotic drugs. They include the street drug heroin. They also include pain medicines, such as morphine, hydrocodone, oxycodone, and fentanyl. Opioid withdrawal is a group of characteristic physical and mental signs and symptoms. It typically occurs if you have been using opioids daily for several weeks or longer and stop using or rapidly decrease use. Opioid withdrawal can also occur if you have used opioids daily for a long time and are given a medicine to block the effect.  SIGNS AND SYMPTOMS Opioid withdrawal includes three or more of the following symptoms:   Depressed, anxious, or irritable mood.  Nausea or vomiting.  Muscle aches or spasms.   Watery eyes.   Runny nose.  Dilated pupils, sweating, or hairs standing on end.  Diarrhea or intestinal cramping.  Yawning.   Fever.  Increased blood pressure.  Fast pulse.  Restlessness or trouble sleeping. These signs and symptoms occur within several hours of stopping or reducing short-acting opioids, such as heroin. They can occur within 3 days of stopping or reducing long-acting opioids, such as methadone. Withdrawal begins within minutes of receiving a drug that blocks the effects of opioids, such as naltrexone or naloxone. DIAGNOSIS  Opioid use disorder is diagnosed by your health care provider. You will be asked about your symptoms, drug and alcohol use, medical history, and use of medicines. A physical exam may be done. Lab tests may be ordered. Your health care provider may have you see a mental health professional.  TREATMENT  The treatment for opioid withdrawal is usually provided by medical doctors with special training in substance use disorders (addiction specialists). The following medicines may be included in treatment:  Opioids given in place of the abused opioid. They turn on opioid receptors in the brain and  lessen or prevent withdrawal symptoms. They are gradually decreased (opioid substitution and taper).  Non-opioids that can lessen certain opioid withdrawal symptoms. They may be used alone or with opioid substitution and taper. Successful long-term recovery usually requires medicine, counseling, and group support. HOME CARE INSTRUCTIONS   Take medicines only as directed by your health care provider.  Check with your health care provider before starting new medicines.  Keep all follow-up visits as directed by your health care provider. SEEK MEDICAL CARE IF:  You are not able to take your medicines as directed.  Your symptoms get worse.  You relapse. SEEK IMMEDIATE MEDICAL CARE IF:  You have serious thoughts about hurting yourself or others.  You have a seizure.  You lose consciousness.   This information is not intended to replace advice given to you by your health care provider. Make sure you discuss any questions you have with your health care provider.   Document Released: 11/25/2003 Document Revised: 12/13/2014 Document Reviewed: 12/05/2013 Elsevier Interactive Patient Education [employer]

## 2015-12-27 NOTE — ED Notes (Signed)
Pt arrives via ems. She is "uncomfortable and not feeling good", she has not had her methadone in 2 days.

## 2015-12-27 NOTE — ED Provider Notes (Signed)
CSN: [id number]     Arrival date & time 12/27/15  2204 History   First MD Initiated Contact with Patient 12/27/15 2244     Chief Complaint  Patient presents with  . Anxiety     (Consider location/radiation/quality/duration/timing/severity/associated sxs/prior Treatment) HPI   Charlotte Mitchell is a 25 y.o. female who presents for evaluation of feeling hot, nauseated and generalized pain. She missed her methadone dose today because she was late to the clinic. Since arriving into the ED, she states that she fell in the bathroom and hurt her neck. She reports passing out several times today. She denies fever, chills, nausea, vomiting, weakness or dizziness. There are no other known modifying factors.   Past Medical History  Diagnosis Date  . No pertinent past medical history   . Substance abuse    Past Surgical History  Procedure Laterality Date  . Cesarean section    . Appendectomy    . Tonsillectomy    . Cesarean section  02/03/2012    Procedure: CESAREAN SECTION;  Surgeon: Loney Laurence, MD;  Location: [facility];  Service: Gynecology;  Laterality: N/A;   No family history on file. Social History  Substance Use Topics  . Smoking status: Current Every Day Smoker -- 0.50 packs/day for 9 years    Types: Cigarettes  . Smokeless tobacco: Never Used  . Alcohol Use: No   OB History    Gravida Para Term Preterm AB TAB SAB Ectopic Multiple Living   Review of Systems  All other systems reviewed and are negative.     Allergies  Hydrocodone  Home Medications   Prior to Admission medications   Medication Sig Start Date End Date Taking? Authorizing Provider  methadone (DOLOPHINE) 10 MG/ML solution Take 150 mg by mouth daily. Gets at methadone clinic on [address]   Yes Historical Provider, MD  ciprofloxacin (CIPRO) 500 MG tablet Take 1 tablet (500 mg total) by mouth 2 (two) times daily. Patient not taking: Reported on 12/27/2015 09/30/14   Oswaldo Conroy, PA-C   sodium chloride (OCEAN NASAL SPRAY) 0.65 % nasal spray Place 1 spray into the nose as needed for congestion. 02/06/12 02/05/13  Waynard Reeds, MD   BP 125/76 mmHg  Pulse 99  Temp(Src) 98.5 F (36.9 C) (Oral)  Resp 20  Ht  (1.626 m)  Wt 180 lb (81.647 kg)  BMI 30.88 kg/m2  SpO2 99% Physical Exam  Constitutional: She is oriented to person, place, and time. She appears well-developed and well-nourished.  She is restless.  HENT:  Head: Normocephalic and atraumatic.  Right Ear: External ear normal.  Left Ear: External ear normal.  Eyes: Conjunctivae and EOM are normal. Pupils are equal, round, and reactive to light.  Neck: Normal range of motion and phonation normal. Neck supple.  Cardiovascular: Normal rate, regular rhythm and normal heart sounds.   Pulmonary/Chest: Effort normal and breath sounds normal. She exhibits no bony tenderness.  Abdominal: Soft. There is no tenderness.  Musculoskeletal: Normal range of motion. She exhibits no tenderness.  Normal range of motion, neck and back. Very minimal left lateral neck tenderness.  Neurological: She is alert and oriented to person, place, and time. No cranial nerve deficit or sensory deficit. She exhibits normal muscle tone. Coordination normal.  No dysarthria, aphasia or nystagmus.  Skin: Skin is warm, dry and intact.  Psychiatric: She has a normal mood and affect. Her behavior is  normal. Judgment and thought content normal.  Nursing note and vitals reviewed.   ED Course  Procedures (including critical care time)  Medications  LORazepam (ATIVAN) tablet 2 mg (not administered)  hydrOXYzine (VISTARIL) injection 50 mg (not administered)    Patient Vitals for the past 24 hrs:  BP Temp Temp src Pulse Resp SpO2 Height Weight  12/27/15 2230 - - - 99 - 99 % - -  12/27/15 2210 125/76 mmHg 98.5 F (36.9 C) Oral 111 20 95 %  (1.626 m) 180 lb (81.647 kg)    11:22 PM Reevaluation with update and discussion. After initial assessment  and treatment, an updated evaluation reveals no further complaints. Findings discussed with the patient. All questions were answered. Sinead Hockman L   Labs Review Labs Reviewed - No data to display  Imaging Review No results found. I have personally reviewed and evaluated these images and lab results as part of my medical decision-making.   EKG Interpretation None      MDM   Final diagnoses:  Narcotic withdrawal (HCC)    Symptoms of narcotic withdrawal because she missed her methadone dose today. Patient understands that we do not dose methadone here. She was treated symptomatically.   Nursing Notes Reviewed/ Care Coordinated Applicable Imaging Reviewed Interpretation of Laboratory Data incorporated into ED treatment  The patient appears reasonably screened and/or stabilized for discharge and I doubt any other medical condition or other [facility] requiring further screening, evaluation, or treatment in the ED at this time prior to discharge.  Plan: Home Medications- usual; Home Treatments- rest; return here if the recommended treatment, does not improve the symptoms; Recommended follow up- PCP prn   Mancel Bale, MD 12/27/15 2323

## 2016-05-03 ENCOUNTER — Emergency Department (HOSPITAL_COMMUNITY): Payer: Medicaid Other

## 2016-05-03 ENCOUNTER — Emergency Department (HOSPITAL_COMMUNITY)
Admission: EM | Admit: 2016-05-03 | Discharge: 2016-05-04 | Disposition: A | Discharge status: Home / Self Care | Payer: Medicaid Other | Attending: Emergency Medicine | Admitting: Emergency Medicine

## 2016-05-03 ENCOUNTER — Encounter (HOSPITAL_COMMUNITY): Payer: Self-pay | Admitting: Emergency Medicine

## 2016-05-03 DIAGNOSIS — K5900 Constipation, unspecified: Secondary | ICD-10-CM | POA: Diagnosis not present

## 2016-05-03 DIAGNOSIS — N12 Tubulo-interstitial nephritis, not specified as acute or chronic: Secondary | ICD-10-CM | POA: Insufficient documentation

## 2016-05-03 DIAGNOSIS — F17210 Nicotine dependence, cigarettes, uncomplicated: Secondary | ICD-10-CM | POA: Insufficient documentation

## 2016-05-03 DIAGNOSIS — Z9049 Acquired absence of other specified parts of digestive tract: Secondary | ICD-10-CM | POA: Insufficient documentation

## 2016-05-03 DIAGNOSIS — R1110 Vomiting, unspecified: Secondary | ICD-10-CM

## 2016-05-03 DIAGNOSIS — R1084 Generalized abdominal pain: Secondary | ICD-10-CM

## 2016-05-03 DIAGNOSIS — Z79899 Other long term (current) drug therapy: Secondary | ICD-10-CM | POA: Insufficient documentation

## 2016-05-03 DIAGNOSIS — F419 Anxiety disorder, unspecified: Secondary | ICD-10-CM | POA: Diagnosis not present

## 2016-05-03 HISTORY — DX: Anxiety disorder, unspecified: F41.9

## 2016-05-03 LAB — COMPREHENSIVE METABOLIC PANEL
ALBUMIN: 3.8 g/dL (ref 3.5–5.0)
ALK PHOS: 70 U/L (ref 38–126)
ALT: 10 U/L — AB (ref 14–54)
AST: 16 U/L (ref 15–41)
Anion gap: 10 (ref 5–15)
BILIRUBIN TOTAL: 1 mg/dL (ref 0.3–1.2)
BUN: 8 mg/dL (ref 6–20)
CALCIUM: 9 mg/dL (ref 8.9–10.3)
CO2: 23 mmol/L (ref 22–32)
CREATININE: 1.08 mg/dL — AB (ref 0.44–1.00)
Chloride: 100 mmol/L — ABNORMAL LOW (ref 101–111)
GFR calc Af Amer: >60 mL/min (ref >60)
GLUCOSE: 124 mg/dL — AB (ref 65–99)
Potassium: 3.6 mmol/L (ref 3.5–5.1)
Sodium: 133 mmol/L — ABNORMAL LOW (ref 135–145)
TOTAL PROTEIN: 7.1 g/dL (ref 6.5–8.1)

## 2016-05-03 LAB — URINE MICROSCOPIC-ADD ON

## 2016-05-03 LAB — URINALYSIS, ROUTINE W REFLEX MICROSCOPIC
BILIRUBIN URINE: NEGATIVE
Glucose, UA: NEGATIVE mg/dL
KETONES UR: NEGATIVE mg/dL
NITRITE: NEGATIVE
PH: 5.5 (ref 5.0–8.0)
PROTEIN: NEGATIVE mg/dL
Specific Gravity, Urine: 1.012 (ref 1.005–1.030)

## 2016-05-03 LAB — PREGNANCY, URINE: Preg Test, Ur: NEGATIVE

## 2016-05-03 LAB — CBC
HEMATOCRIT: 39.4 % (ref 36.0–46.0)
Hemoglobin: 13 g/dL (ref 12.0–15.0)
MCH: 32.6 pg (ref 26.0–34.0)
MCHC: 33 g/dL (ref 30.0–36.0)
MCV: 98.7 fL (ref 78.0–100.0)
PLATELETS: 150 10*3/uL (ref 150–400)
RBC: 3.99 MIL/uL (ref 3.87–5.11)
RDW: 11.6 % (ref 11.5–15.5)
WBC: 11.2 10*3/uL — AB (ref 4.0–10.5)

## 2016-05-03 LAB — LIPASE, BLOOD: Lipase: 16 U/L (ref 11–51)

## 2016-05-03 MED ORDER — SODIUM CHLORIDE 0.9 % IV SOLN
INTRAVENOUS | Status: DC
  Administered 2016-05-03: 21:00:00 via INTRAVENOUS

## 2016-05-03 MED ORDER — ACETAMINOPHEN 325 MG PO TABS
650.0000 mg | ORAL_TABLET | Freq: Once | ORAL | Status: AC
  Administered 2016-05-03: 650 mg via ORAL
  Filled 2016-05-03: qty 2

## 2016-05-03 MED ORDER — IOPAMIDOL (ISOVUE-300) INJECTION 61%
100.0000 mL | Freq: Once | INTRAVENOUS | Status: AC | PRN
Start: 2016-05-03 — End: 2016-05-03
  Administered 2016-05-03: 100 mL via INTRAVENOUS

## 2016-05-03 MED ORDER — ONDANSETRON HCL 4 MG/2ML IJ SOLN
4.0000 mg | Freq: Once | INTRAMUSCULAR | Status: AC
  Administered 2016-05-03: 4 mg via INTRAVENOUS
  Filled 2016-05-03: qty 2

## 2016-05-03 MED ORDER — FENTANYL CITRATE (PF) 100 MCG/2ML IJ SOLN
50.0000 ug | Freq: Once | INTRAMUSCULAR | Status: AC
  Administered 2016-05-03: 50 ug via INTRAVENOUS
  Filled 2016-05-03: qty 2

## 2016-05-03 MED ORDER — DEXTROSE 5 % IV SOLN
1.0000 g | Freq: Once | INTRAVENOUS | Status: AC
  Administered 2016-05-04: 1 g via INTRAVENOUS
  Filled 2016-05-03: qty 10

## 2016-05-03 MED ORDER — SODIUM CHLORIDE 0.9 % IV BOLUS (SEPSIS)
1000.0000 mL | Freq: Once | INTRAVENOUS | Status: AC
  Administered 2016-05-03: 1000 mL via INTRAVENOUS

## 2016-05-03 NOTE — ED Notes (Signed)
Pt. reports intermittent generalized abdominal pain with nausea and emesis onset 2 weeks ago , pt.  added constipation for several days .

## 2016-05-04 MED ORDER — CEPHALEXIN 500 MG PO CAPS: 500.0000 mg | ORAL_CAPSULE | Freq: Four times a day (QID) | ORAL | Status: DC

## 2016-05-04 MED ORDER — PROMETHAZINE HCL 25 MG PO TABS: 25.0000 mg | ORAL_TABLET | Freq: Four times a day (QID) | ORAL | Status: DC | PRN

## 2016-05-04 MED ORDER — ONDANSETRON 4 MG PO TBDP
4.0000 mg | ORAL_TABLET | Freq: Three times a day (TID) | ORAL | Status: DC | PRN
Start: 2016-05-04 — End: 2017-10-09

## 2016-05-04 NOTE — Discharge Instructions (Signed)
Take antibiotic as directed. Take the Zofran and Phenergan as needed for nausea and vomiting. Return for any new or worse symptoms or if not improving in 2 days.

## 2016-05-04 NOTE — ED Provider Notes (Signed)
CSN: [id number]     Arrival date & time 05/03/16  1910 History   First MD Initiated Contact with Patient 05/03/16 2016     Chief Complaint  Patient presents with  . Abdominal Pain     (Consider location/radiation/quality/duration/timing/severity/associated sxs/prior Treatment) Patient is a 25 y.o. female presenting with abdominal pain.  Abdominal Pain Associated symptoms: constipation, fever, nausea and vomiting   Associated symptoms: no chest pain, no diarrhea, no dysuria and no shortness of breath   Patient with a complaint of generalized abdominal pain really for 2 months been more constant for the past 2 weeks. Has subjectively felt as if she had a fever. Temp was 100.6 here. No dysuria no vaginal discharge. Associated with nausea and vomiting. 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  Past Medical History  Diagnosis Date  . No pertinent past medical history   . Substance abuse   . Anxiety    Past Surgical History  Procedure Laterality Date  . Cesarean section    . Appendectomy    . Tonsillectomy    . Cesarean section  02/03/2012    Procedure: CESAREAN SECTION;  Surgeon: Loney Laurence, MD;  Location: [facility];  Service: Gynecology;  Laterality: N/A;   No family history on file. Social History  Substance Use Topics  . Smoking status: Current Every Day Smoker -- 0.00 packs/day for 9 years    Types: Cigarettes  . Smokeless tobacco: Never Used  . Alcohol Use: No   OB History    Gravida Para Term Preterm AB TAB SAB Ectopic Multiple Living   3 3 3       3      Review of Systems  Constitutional: Positive for fever.  HENT: Negative for congestion.   Eyes: Negative for visual disturbance.  Respiratory: Negative for shortness of breath.   Cardiovascular: Negative for chest pain.  Gastrointestinal: Positive for nausea, vomiting, abdominal pain and constipation. Negative for diarrhea.  Genitourinary: Negative for dysuria.  Musculoskeletal: Negative for  back pain.  Neurological: Negative for headaches.  Hematological: Does not bruise/bleed easily.  Psychiatric/Behavioral: Negative for confusion.      Allergies  Hydrocodone  Home Medications   Prior to Admission medications   Medication Sig Start Date End Date Taking? Authorizing Provider  acetaminophen (TYLENOL) 500 MG tablet Take 500-1,000 mg by mouth every 6 (six) hours as needed for mild pain, moderate pain or headache.   Yes Historical Provider, MD  Ca Carbonate-Mag Hydroxide (ROLAIDS PO) Take 1-2 tablets by mouth 4 (four) times daily as needed.   Yes Historical Provider, MD  gabapentin (NEURONTIN) 300 MG capsule Take 600 mg by mouth 3 (three) times daily. 04/17/16  Yes Historical Provider, MD  methadone (DOLOPHINE) 10 MG/ML solution Take 90 mg by mouth every morning.    Yes Historical Provider, MD  cephALEXin (KEFLEX) 500 MG capsule Take 1 capsule (500 mg total) by mouth 4 (four) times daily. 05/04/16   Vanetta Mulders, MD  ciprofloxacin (CIPRO) 500 MG tablet Take 1 tablet (500 mg total) by mouth 2 (two) times daily. Patient not taking: Reported on 12/27/2015 09/30/14   Oswaldo Conroy, PA-C  ondansetron (ZOFRAN ODT) 4 MG disintegrating tablet Take 1 tablet (4 mg total) by mouth every 8 (eight) hours as needed for nausea or vomiting. 05/04/16   Vanetta Mulders, MD  promethazine (PHENERGAN) 25 MG tablet Take 1 tablet (25 mg total) by mouth every 6 (six) hours as needed for nausea or vomiting. 05/04/16  Vanetta MuldersScott Yechezkel Fertig, MD  sodium chloride (OCEAN NASAL SPRAY) 0.65 % nasal spray Place 1 spray into the nose as needed for congestion. 02/06/12 02/05/13  Waynard ReedsKendra Ross, MD   BP 115/72 mmHg  Pulse 87  Temp(Src) 100.6 F (38.1 C) (Oral)  Resp 16  Ht 5\' 4"  (1.626 m)  Wt 99.338 kg  BMI 37.57 kg/m2  SpO2 91%  LMP 04/26/2016 (Approximate) Physical Exam  Constitutional: She is oriented to person, place, and time. She appears well-developed and well-nourished. No distress.  HENT:  Head:  Normocephalic and atraumatic.  Mouth/Throat: Oropharynx is clear and moist.  Eyes: Conjunctivae and EOM are normal. Pupils are equal, round, and reactive to light.  Neck: Normal range of motion. Neck supple.  Cardiovascular: Normal rate, regular rhythm and normal heart sounds.   No murmur heard. Pulmonary/Chest: Effort normal and breath sounds normal. No respiratory distress.  Abdominal: Soft. Bowel sounds are normal. There is no tenderness.  Musculoskeletal: Normal range of motion.  Neurological: She is alert and oriented to person, place, and time. No cranial nerve deficit. She exhibits normal muscle tone. Coordination normal.  Skin: Skin is warm. No rash noted.  Nursing note and vitals reviewed.   ED Course  Procedures (including critical care time) Labs Review Labs Reviewed  COMPREHENSIVE METABOLIC PANEL - Abnormal; Notable for the following:    Sodium 133 (*)    Chloride 100 (*)    Glucose, Bld 124 (*)    Creatinine, Ser 1.08 (*)    ALT 10 (*)    All other components within normal limits  CBC - Abnormal; Notable for the following:    WBC 11.2 (*)    All other components within normal limits  URINALYSIS, ROUTINE W REFLEX MICROSCOPIC (NOT AT [facility]) - Abnormal; Notable for the following:    APPearance CLOUDY (*)    Hgb urine dipstick LARGE (*)    Leukocytes, UA LARGE (*)    All other components within normal limits  URINE MICROSCOPIC-ADD ON - Abnormal; Notable for the following:    Squamous Epithelial / LPF 6-30 (*)    Bacteria, UA FEW (*)    All other components within normal limits  URINE CULTURE  URINE CULTURE  LIPASE, BLOOD  PREGNANCY, URINE  POC URINE PREG, ED  .[facility]   Imaging Review Ct Abdomen Pelvis W Contrast  05/03/2016  CLINICAL DATA:  Generalized abdominal pain for 2 weeks. Nausea and vomiting. EXAM: CT ABDOMEN AND PELVIS WITH CONTRAST TECHNIQUE: Multidetector CT imaging of the abdomen and pelvis was performed using the standard protocol following bolus  administration of intravenous contrast. CONTRAST:  100mL ISOVUE-300 IOPAMIDOL (ISOVUE-300) INJECTION 61% COMPARISON:  10/12/2010 FINDINGS: Lower chest:  Mild dependent atelectasis.  No pleural effusion. Liver: No focal lesion. Hepatobiliary: Gallbladder physiologically distended, no calcified stone. No biliary dilatation. Pancreas: No ductal dilatation or inflammation. Spleen: Calcified granuloma inferiorly. Spleen is prominent in size measuring 14.4 cm cranial caudal. Adrenal glands: No nodule. Kidneys: There is mild right hydroureteronephrosis with dilatation of the ureter to the level of the mid sacrum. No obstructing stone. There is a delayed nephrogram and urothelial thickening. Mild perinephric edema. No perirenal or intrarenal fluid collection. Left kidney appears normal. Stomach/Bowel: Stomach physiologically distended. There are no dilated or thickened small bowel loops. Moderate volume of stool throughout the colon without colonic wall thickening. The appendix is not visualized, surgically absent per report. Vascular/Lymphatic: No retroperitoneal adenopathy. Abdominal aorta is normal in caliber. Reproductive: Uterus and adnexa are normal for age. Bladder: Physiologically distended.  No definite wall thickening or perivesicular inflammation. Other: No free air, free fluid, or intra-abdominal fluid collection. Musculoskeletal: There are no acute or suspicious osseous abnormalities. IMPRESSION: 1. Findings consistent with right pyelonephritis. 2. Mild splenomegaly. Electronically Signed   By: Rubye Oaks M.D.   On: 05/03/2016 23:05   I have personally reviewed and evaluated these images and lab results as part of my medical decision-making.   EKG Interpretation None      MDM   Final diagnoses:  Pyelonephritis  Generalized abdominal pain  Intractable vomiting with nausea, vomiting of unspecified type    Patient with 2 month history of generalized abdominal pain with nausea and vomiting.  Really no dysuria was concerned about constipation. But CT scan read by radiologist that there is right pyelonephritis. Will treat her for that but clinically does not explain all her symptoms. We'll also treat with Zofran and Phenergan for the nausea and vomiting. Patient without any other acute findings on the CT scan. Urinalysis not completely normal but certainly not consistent normally with the findings of [place]. Urine culture sent. Mild leukocytosis with a white count of 11.2. Electrolytes without significant abnormalities even though some mild electrolyte abnormalities. Pregnancy test was negative. Patient will return for any new or worse symptoms.    Vanetta Mulders, MD 05/04/16 [phone]

## 2016-05-05 LAB — URINE CULTURE

## 2016-06-18 ENCOUNTER — Ambulatory Visit (HOSPITAL_COMMUNITY)
Admission: EM | Admit: 2016-06-18 | Discharge: 2016-06-18 | Disposition: A | Discharge status: Home / Self Care | Payer: Medicaid Other | Attending: Internal Medicine | Admitting: Internal Medicine

## 2016-06-18 ENCOUNTER — Encounter (HOSPITAL_COMMUNITY): Payer: Self-pay | Admitting: Emergency Medicine

## 2016-06-18 DIAGNOSIS — S0502XA Injury of conjunctiva and corneal abrasion without foreign body, left eye, initial encounter: Secondary | ICD-10-CM

## 2016-06-18 MED ORDER — TRAMADOL HCL 50 MG PO TABS: 50.0000 mg | ORAL_TABLET | Freq: Four times a day (QID) | ORAL | Status: DC | PRN

## 2016-06-18 MED ORDER — TOBRAMYCIN 0.3 % OP SOLN: 2.0000 [drp] | OPHTHALMIC | Status: DC

## 2016-06-18 NOTE — Discharge Instructions (Signed)

## 2016-06-18 NOTE — ED Provider Notes (Signed)
CSN: [id number]     Arrival date & time 06/18/16  1012 History   First MD Initiated Contact with Patient 06/18/16 1102     Chief Complaint  Patient presents with  . Eye Injury   (Consider location/radiation/quality/duration/timing/severity/associated sxs/prior Treatment) Patient is a 25 y.o. female presenting with eye injury. The history is provided by the patient. No language interpreter was used.  Eye Injury This is a new problem. The current episode started 2 days ago. The problem occurs constantly. The problem has been gradually worsening. Pertinent negatives include no shortness of breath. Nothing aggravates the symptoms. Nothing relieves the symptoms. She has tried nothing for the symptoms. The treatment provided no relief.  Pt reports her eye is red and swollen.  Pt reports she stuck eye with mascara wand.   Past Medical History  Diagnosis Date  . No pertinent past medical history   . Substance abuse   . Anxiety    Past Surgical History  Procedure Laterality Date  . Cesarean section    . Appendectomy    . Tonsillectomy    . Cesarean section  02/03/2012    Procedure: CESAREAN SECTION;  Surgeon: Loney Laurence, MD;  Location: [facility];  Service: Gynecology;  Laterality: N/A;   History reviewed. No pertinent family history. Social History  Substance Use Topics  . Smoking status: Current Every Day Smoker -- 0.00 packs/day for 9 years    Types: Cigarettes  . Smokeless tobacco: Never Used  . Alcohol Use: No   OB History    Gravida Para Term Preterm AB TAB SAB Ectopic Multiple Living   Review of Systems  Respiratory: Negative for shortness of breath.   All other systems reviewed and are negative.   Allergies  Hydrocodone  Home Medications   Prior to Admission medications   Medication Sig Start Date End Date Taking? Authorizing Provider  gabapentin (NEURONTIN) 300 MG capsule Take 600 mg by mouth 3 (three) times daily. 04/17/16  Yes Historical  Provider, MD  hydrOXYzine (ATARAX/VISTARIL) 50 MG tablet Take 50 mg by mouth 3 (three) times daily as needed.   Yes Historical Provider, MD  methadone (DOLOPHINE) 10 MG/ML solution Take 90 mg by mouth every morning.    Yes Historical Provider, MD  QUEtiapine (SEROQUEL) 100 MG tablet Take 100 mg by mouth at bedtime.   Yes Historical Provider, MD  acetaminophen (TYLENOL) 500 MG tablet Take 500-1,000 mg by mouth every 6 (six) hours as needed for mild pain, moderate pain or headache.    Historical Provider, MD  Ca Carbonate-Mag Hydroxide (ROLAIDS PO) Take 1-2 tablets by mouth 4 (four) times daily as needed.    Historical Provider, MD  cephALEXin (KEFLEX) 500 MG capsule Take 1 capsule (500 mg total) by mouth 4 (four) times daily. 05/04/16   Vanetta Mulders, MD  ciprofloxacin (CIPRO) 500 MG tablet Take 1 tablet (500 mg total) by mouth 2 (two) times daily. Patient not taking: Reported on 12/27/2015 09/30/14   Oswaldo Conroy, PA-C  ondansetron (ZOFRAN ODT) 4 MG disintegrating tablet Take 1 tablet (4 mg total) by mouth every 8 (eight) hours as needed for nausea or vomiting. 05/04/16   Vanetta Mulders, MD  promethazine (PHENERGAN) 25 MG tablet Take 1 tablet (25 mg total) by mouth every 6 (six) hours as needed for nausea or vomiting. 05/04/16   Vanetta Mulders, MD  sodium chloride (OCEAN NASAL SPRAY) 0.65 % nasal spray Place 1  spray into the nose as needed for congestion. 02/06/12 02/05/13  Waynard ReedsKendra Ross, MD  tobramycin (TOBREX) 0.3 % ophthalmic solution Place 2 drops into the left eye every 4 (four) hours. 06/18/16   Elson AreasLeslie K Sofia, PA-C  traMADol (ULTRAM) 50 MG tablet Take 1 tablet (50 mg total) by mouth every 6 (six) hours as needed. 06/18/16   Elson AreasLeslie K Sofia, PA-C   Meds Ordered and Administered this Visit  Medications - No data to display  BP 99/74 mmHg  Pulse 89  Temp(Src) 99 F (37.2 C) (Oral)  Resp 18  SpO2 98%  LMP 06/13/2016 No data found.   Physical Exam  Constitutional: She appears well-developed  and well-nourished.  HENT:  Head: Normocephalic and atraumatic.  Eyes: Conjunctivae and EOM are normal. Pupils are equal, round, and reactive to light. Left eye exhibits discharge.  Left eye injected, erythematous, drainage.  fluro large area of uptake mid cornea,   Cardiovascular: Normal rate.   Pulmonary/Chest: Effort normal.  Abdominal: Soft.  Musculoskeletal: Normal range of motion.  Neurological: She is alert.  Skin: Skin is warm.  Psychiatric: She has a normal mood and affect.  Nursing note and vitals reviewed.   ED Course  Procedures (including critical care time)  Labs Review Labs Reviewed - No data to display  Imaging Review No results found.   Visual Acuity Review  Right Eye Distance:   Left Eye Distance:   Bilateral Distance:    Right Eye Near:   Left Eye Near:    Bilateral Near:         MDM   1. Corneal abrasion, left, initial encounter     Meds ordered this encounter  Medications  . QUEtiapine (SEROQUEL) 100 MG tablet    Sig: Take 100 mg by mouth at bedtime.  . hydrOXYzine (ATARAX/VISTARIL) 50 MG tablet    Sig: Take 50 mg by mouth 3 (three) times daily as needed.  . tobramycin (TOBREX) 0.3 % ophthalmic solution    Sig: Place 2 drops into the left eye every 4 (four) hours.    Dispense:  5 mL    Refill:  0    Order Specific Question:  Supervising Provider    Answer:  Eustace MooreMURRAY, LAURA W [[id number]]  . traMADol (ULTRAM) 50 MG tablet    Sig: Take 1 tablet (50 mg total) by mouth every 6 (six) hours as needed.    Dispense:  12 tablet    Refill:  0    Order Specific Question:  Supervising Provider    Answer:  Eustace MooreMURRAY, LAURA W [[id number]]     Elson AreasLeslie K Sofia, PA-C 06/18/16 1450  Lonia SkinnerLeslie K WoodsboroSofia, [place] JerseyPA-C 06/18/16 1451

## 2016-06-18 NOTE — ED Notes (Signed)
Pt reports she inj her left eye 3 days ago while using mascara... She accidentally poked herself w/it Sx today include: pain, redness, watery and blurred vision A&O x4... NAD

## 2017-10-08 ENCOUNTER — Emergency Department (HOSPITAL_COMMUNITY)
Admission: EM | Admit: 2017-10-08 | Discharge: 2017-10-09 | Disposition: A | Discharge status: Home / Self Care | Payer: Self-pay | Attending: Emergency Medicine | Admitting: Emergency Medicine

## 2017-10-08 ENCOUNTER — Encounter (HOSPITAL_COMMUNITY): Payer: Self-pay | Admitting: Emergency Medicine

## 2017-10-08 DIAGNOSIS — F17210 Nicotine dependence, cigarettes, uncomplicated: Secondary | ICD-10-CM | POA: Insufficient documentation

## 2017-10-08 DIAGNOSIS — F1920 Other psychoactive substance dependence, uncomplicated: Secondary | ICD-10-CM | POA: Insufficient documentation

## 2017-10-08 DIAGNOSIS — F1120 Opioid dependence, uncomplicated: Secondary | ICD-10-CM | POA: Diagnosis present

## 2017-10-08 DIAGNOSIS — Z79899 Other long term (current) drug therapy: Secondary | ICD-10-CM | POA: Insufficient documentation

## 2017-10-08 DIAGNOSIS — F1910 Other psychoactive substance abuse, uncomplicated: Secondary | ICD-10-CM | POA: Insufficient documentation

## 2017-10-08 LAB — PREGNANCY, URINE: PREG TEST UR: NEGATIVE

## 2017-10-08 LAB — CBC
HCT: 34.2 % — ABNORMAL LOW (ref 36.0–46.0)
HEMOGLOBIN: 11.5 g/dL — AB (ref 12.0–15.0)
MCH: 31.3 pg (ref 26.0–34.0)
MCHC: 33.6 g/dL (ref 30.0–36.0)
MCV: 92.9 fL (ref 78.0–100.0)
PLATELETS: 112 10*3/uL — AB (ref 150–400)
RBC: 3.68 MIL/uL — AB (ref 3.87–5.11)
RDW: 13.2 % (ref 11.5–15.5)
WBC: 3.6 10*3/uL — AB (ref 4.0–10.5)

## 2017-10-08 LAB — COMPREHENSIVE METABOLIC PANEL
ALBUMIN: 3.6 g/dL (ref 3.5–5.0)
ALT: 23 U/L (ref 14–54)
AST: 32 U/L (ref 15–41)
Alkaline Phosphatase: 61 U/L (ref 38–126)
Anion gap: 9 (ref 5–15)
BUN: 6 mg/dL (ref 6–20)
CHLORIDE: 105 mmol/L (ref 101–111)
CO2: 25 mmol/L (ref 22–32)
CREATININE: 0.76 mg/dL (ref 0.44–1.00)
Calcium: 8.7 mg/dL — ABNORMAL LOW (ref 8.9–10.3)
GFR calc Af Amer: >60 mL/min (ref >60)
GFR calc non Af Amer: >60 mL/min (ref >60)
GLUCOSE: 102 mg/dL — AB (ref 65–99)
Potassium: 3.6 mmol/L (ref 3.5–5.1)
SODIUM: 139 mmol/L (ref 135–145)
Total Bilirubin: 0.4 mg/dL (ref 0.3–1.2)
Total Protein: 6.7 g/dL (ref 6.5–8.1)

## 2017-10-08 LAB — ACETAMINOPHEN LEVEL: Acetaminophen (Tylenol), Serum: <10 ug/mL — ABNORMAL LOW (ref 10–30)

## 2017-10-08 LAB — ETHANOL: Alcohol, Ethyl (B): <10 mg/dL (ref <10)

## 2017-10-08 LAB — SALICYLATE LEVEL: Salicylate Lvl: <7 mg/dL (ref 2.8–30.0)

## 2017-10-08 LAB — RAPID URINE DRUG SCREEN, HOSP PERFORMED
AMPHETAMINES: NOT DETECTED
BENZODIAZEPINES: POSITIVE — AB
Barbiturates: NOT DETECTED
Cocaine: POSITIVE — AB
Opiates: POSITIVE — AB
TETRAHYDROCANNABINOL: NOT DETECTED

## 2017-10-08 MED ORDER — GABAPENTIN 300 MG PO CAPS
300.0000 mg | ORAL_CAPSULE | Freq: Three times a day (TID) | ORAL | Status: DC
  Administered 2017-10-08 – 2017-10-09 (×3): 300 mg via ORAL
  Filled 2017-10-08 (×3): qty 1

## 2017-10-08 MED ORDER — NAPROXEN 500 MG PO TABS
500.0000 mg | ORAL_TABLET | Freq: Two times a day (BID) | ORAL | Status: DC | PRN
  Administered 2017-10-09: 500 mg via ORAL
  Filled 2017-10-08: qty 1

## 2017-10-08 MED ORDER — METHOCARBAMOL 500 MG PO TABS: 500.0000 mg | ORAL_TABLET | Freq: Three times a day (TID) | ORAL | Status: DC | PRN

## 2017-10-08 MED ORDER — NAPROXEN 500 MG PO TABS: 500.0000 mg | ORAL_TABLET | Freq: Two times a day (BID) | ORAL | Status: DC | PRN

## 2017-10-08 MED ORDER — CLONIDINE HCL 0.1 MG PO TABS: 0.1000 mg | ORAL_TABLET | ORAL | Status: DC

## 2017-10-08 MED ORDER — METHOCARBAMOL 500 MG PO TABS
500.0000 mg | ORAL_TABLET | Freq: Three times a day (TID) | ORAL | Status: DC | PRN
  Administered 2017-10-09: 500 mg via ORAL
  Filled 2017-10-08: qty 1

## 2017-10-08 MED ORDER — ONDANSETRON 4 MG PO TBDP: 4.0000 mg | ORAL_TABLET | Freq: Four times a day (QID) | ORAL | Status: DC | PRN

## 2017-10-08 MED ORDER — HYDROXYZINE HCL 25 MG PO TABS: 25.0000 mg | ORAL_TABLET | Freq: Four times a day (QID) | ORAL | Status: DC | PRN

## 2017-10-08 MED ORDER — CLONIDINE HCL 0.1 MG PO TABS: 0.1000 mg | ORAL_TABLET | Freq: Every day | ORAL | Status: DC

## 2017-10-08 MED ORDER — HYDROXYZINE HCL 25 MG PO TABS
25.0000 mg | ORAL_TABLET | Freq: Four times a day (QID) | ORAL | Status: DC | PRN
  Administered 2017-10-09: 25 mg via ORAL
  Filled 2017-10-08: qty 1

## 2017-10-08 MED ORDER — LOPERAMIDE HCL 2 MG PO CAPS: 2.0000 mg | ORAL_CAPSULE | ORAL | Status: DC | PRN

## 2017-10-08 MED ORDER — DICYCLOMINE HCL 20 MG PO TABS: 20.0000 mg | ORAL_TABLET | Freq: Four times a day (QID) | ORAL | Status: DC | PRN

## 2017-10-08 MED ORDER — CLONIDINE HCL 0.1 MG PO TABS: 0.1000 mg | ORAL_TABLET | Freq: Four times a day (QID) | ORAL | Status: DC

## 2017-10-08 MED ORDER — DICYCLOMINE HCL 20 MG PO TABS
20.0000 mg | ORAL_TABLET | Freq: Four times a day (QID) | ORAL | Status: DC | PRN
  Administered 2017-10-09: 20 mg via ORAL
  Filled 2017-10-08: qty 1

## 2017-10-08 MED ORDER — CLONIDINE HCL 0.1 MG PO TABS
0.1000 mg | ORAL_TABLET | Freq: Once | ORAL | Status: AC
  Administered 2017-10-08: 0.1 mg via ORAL
  Filled 2017-10-08: qty 1

## 2017-10-08 NOTE — ED Notes (Signed)
Pt was walked to the [facility] by staff and fell asleep. She would not open her eyes when her name called. Respirations even and regular. Unable for psychosocial assessment at this time.

## 2017-10-08 NOTE — ED Notes (Signed)
Pt brought in by [employer] with IVC paperwork, [place] notified the magistrate the name on the IVC papers is spelled incorrectly. Cain SieveAdamson, Magistrate gave verbal consent to correct and initial. Samanthe was corrected to [place].
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
## 2017-10-08 NOTE — ED Notes (Signed)
Hourly rounding reveals patient sleeping in room. No complaints, stable, in no acute distress. Q15 minute rounds and monitoring via Security Cameras to continue. 

## 2017-10-08 NOTE — ED Notes (Signed)
Report to include situation, background, assessment and recommendations from Diane RN. Patient sleeping, respirations regular and unlabored. Q15 minute rounds and security camera observation to continue.   

## 2017-10-08 NOTE — ED Provider Notes (Signed)
[facility]-EMERGENCY DEPT Provider Note   CSN: [id number] Arrival date & time: 10/08/17  1302     History   Chief Complaint Chief Complaint  Patient presents with  . Medical Clearance    IVC'd    HPI Charlotte Mitchell is a 26 y.o. female with PMH/o anxiety, Substance abuse who presents with IVC order. Per records sent with patient, patient was seen at [facility] and was deemed a danger to herself. She disclosed at [facility] that she had recently been using IV heroin, crack cocaine and benzos. She reports her most recent usage was yesterday approximately 12:30 PM. Per record, patient has a history of seizures from withdrawal. [facility] facility felt like patient was a danger to herself given drug use and unwillingness to stop. The contact dad and when dad came and talked to her, dad was concerned about expressed suicidal ideations from patient. Patient denied any SI/HI at facility.  Dad filed an IVC on patient with concerns for her own safety. Patient reports that she last used crack cocaine and heroin yesterday. Patient denies any recent alcohol or marijuana use. Patient states that she does not want to stay here, only that she "wants to just do her drugs." Patient denies any fever, chest pain, difficult to breathing, abdominal pain, dysuria, hematuria. Patient denies any SI/HI or auditory/visual hallucinations.  The history is provided by the patient.    Past Medical History:  Diagnosis Date  . Anxiety   . No pertinent past medical history   . Substance abuse (HCC)     There are no active problems to display for this patient.   Past Surgical History:  Procedure Laterality Date  . APPENDECTOMY    . CESAREAN SECTION    . CESAREAN SECTION  02/03/2012   Procedure: CESAREAN SECTION;  Surgeon: Loney Laurence, MD;  Location: [facility] ORS;  Service: Gynecology;  Laterality: N/A;  . TONSILLECTOMY      OB History    Gravida Para Term Preterm AB Living   3 3 3     3    SAB  TAB Ectopic Multiple Live Births           3       Home Medications    Prior to Admission medications   Medication Sig Start Date End Date Taking? Authorizing Provider  acetaminophen (TYLENOL) 500 MG tablet Take 500-1,000 mg by mouth every 6 (six) hours as needed for mild pain, moderate pain or headache.    [provider]  Ca Carbonate-Mag Hydroxide (ROLAIDS PO) Take 1-2 tablets by mouth 4 (four) times daily as needed.    [provider]  cephALEXin (KEFLEX) 500 MG capsule Take 1 capsule (500 mg total) by mouth 4 (four) times daily. 05/04/16   Vanetta Mulders, MD  ciprofloxacin (CIPRO) 500 MG tablet Take 1 tablet (500 mg total) by mouth 2 (two) times daily. Patient not taking: Reported on 12/27/2015 09/30/14   Oswaldo Conroy, PA-C  gabapentin (NEURONTIN) 300 MG capsule Take 600 mg by mouth 3 (three) times daily. 04/17/16   [provider]  hydrOXYzine (ATARAX/VISTARIL) 50 MG tablet Take 50 mg by mouth 3 (three) times daily as needed.    [provider]  methadone (DOLOPHINE) 10 MG/ML solution Take 90 mg by mouth every morning.     [provider]  ondansetron (ZOFRAN ODT) 4 MG disintegrating tablet Take 1 tablet (4 mg total) by mouth every 8 (eight) hours as needed for nausea or vomiting. 05/04/16  Vanetta MuldersZackowski, Scott, MD  promethazine (PHENERGAN) 25 MG tablet Take 1 tablet (25 mg total) by mouth every 6 (six) hours as needed for nausea or vomiting. 05/04/16   Vanetta MuldersZackowski, Scott, MD  QUEtiapine (SEROQUEL) 100 MG tablet Take 100 mg by mouth at bedtime.    [provider]  sodium chloride (OCEAN NASAL SPRAY) 0.65 % nasal spray Place 1 spray into the nose as needed for congestion. 02/06/12 02/05/13  Waynard Reedsoss, Kendra, MD  tobramycin (TOBREX) 0.3 % ophthalmic solution Place 2 drops into the left eye every 4 (four) hours. 06/18/16   Elson AreasSofia, Leslie K, PA-C  traMADol (ULTRAM) 50 MG tablet Take 1 tablet (50 mg total) by mouth every 6 (six) hours as needed. 06/18/16    Elson AreasSofia, Leslie K, PA-C    Family History No family history on file.  Social History Social History  Substance Use Topics  . Smoking status: Current Every Day Smoker    Packs/day: 0.00    Years: 9.00    Types: Cigarettes  . Smokeless tobacco: Never Used  . Alcohol use No     Allergies   Hydrocodone   Review of Systems Review of Systems  Constitutional: Negative for fever.  Respiratory: Negative for shortness of breath.   Cardiovascular: Negative for chest pain.  Gastrointestinal: Negative for abdominal pain, nausea and vomiting.  Genitourinary: Negative for dysuria and hematuria.  Psychiatric/Behavioral: Negative for hallucinations and suicidal ideas.     Physical Exam Updated Vital Signs BP 109/64   Pulse (!) 54   Temp 98.5 F (36.9 C) (Oral)   Resp 20   LMP 09/27/2017   SpO2 99%   Physical Exam  Constitutional: She is oriented to person, place, and time. She appears well-developed and well-nourished.  Sitting comfortably in examination table eating dinner  HENT:  Head: Normocephalic and atraumatic.  Mouth/Throat: Oropharynx is clear and moist and mucous membranes are normal.  Eyes: Conjunctivae, EOM and lids are normal.  6 mm pupils bilaterally  Neck: Full passive range of motion without pain.  Cardiovascular: Normal rate, regular rhythm and normal pulses.   Pulses:      Radial pulses are 2+ on the right side, and 2+ on the left side.  Pulmonary/Chest: Effort normal and breath sounds normal.  Abdominal: Soft. Normal appearance. There is no tenderness. There is no rigidity and no guarding.  Musculoskeletal: Normal range of motion.  Neurological: She is alert and oriented to person, place, and time. GCS eye subscore is 4. GCS verbal subscore is 5. GCS motor subscore is 6.  Skin: Skin is warm and dry. Capillary refill takes less than 2 seconds.  Track marks noted to anterior aspect of BUE  Psychiatric: Her speech is normal. Her mood appears anxious. She is  hyperactive. She expresses no homicidal and no suicidal ideation. She expresses no suicidal plans and no homicidal plans.  Nursing note and vitals reviewed.    ED Treatments / Results  Labs (all labs ordered are listed, but only abnormal results are displayed) Labs Reviewed  COMPREHENSIVE METABOLIC PANEL - Abnormal; Notable for the following:       Result Value   Glucose, Bld 102 (*)    Calcium 8.7 (*)    All other components within normal limits  ACETAMINOPHEN LEVEL - Abnormal; Notable for the following:    Acetaminophen (Tylenol), Serum <10 (*)    All other components within normal limits  CBC - Abnormal; Notable for the following:    WBC 3.6 (*)    RBC  3.68 (*)    Hemoglobin 11.5 (*)    HCT 34.2 (*)    Platelets 112 (*)    All other components within normal limits  RAPID URINE DRUG SCREEN, HOSP PERFORMED - Abnormal; Notable for the following:    Opiates POSITIVE (*)    Cocaine POSITIVE (*)    Benzodiazepines POSITIVE (*)    All other components within normal limits  ETHANOL  SALICYLATE LEVEL  PREGNANCY, URINE    EKG  EKG Interpretation  Date/Time:  Saturday October 08 2017 15:16:13 EDT Ventricular Rate:  63 PR Interval:  138 QRS Duration: 82 QT Interval:  410 QTC Calculation: 419 R Axis:   80 Text Interpretation:  Normal sinus rhythm Normal ECG Confirmed by Pricilla Loveless [phone]) on 10/08/2017 3:21:48 PM       Radiology No results found.  Procedures Procedures (including critical care time)  Medications Ordered in ED Medications  dicyclomine (BENTYL) tablet 20 mg (not administered)  hydrOXYzine (ATARAX/VISTARIL) tablet 25 mg (not administered)  loperamide (IMODIUM) capsule 2-4 mg (not administered)  methocarbamol (ROBAXIN) tablet 500 mg (not administered)  naproxen (NAPROSYN) tablet 500 mg (not administered)  ondansetron (ZOFRAN-ODT) disintegrating tablet 4 mg (not administered)  gabapentin (NEURONTIN) capsule 300 mg (300 mg Oral Given 10/08/17 1705)    cloNIDine (CATAPRES) tablet 0.1 mg (0.1 mg Oral Given 10/08/17 1705)     Initial Impression / Assessment and Plan / ED Course  I have reviewed the triage vital signs and the nursing notes.  Pertinent labs & imaging results that were available during my care of the patient were reviewed by me and considered in my medical decision making (see chart for details).     26 year old female who presents with history of substance abuse who presents with an IVC in place for concerns of safety. Patient seen on monitor facility today and endorsed heroine, cocaine and benzo use. Patient was unwilling to stop. [facility] was concerned about patient's safety to herself and concerned that patient has seizures with withdrawal. They called dad and dad said that patient expressed SI and follow IVC first concern of patient's safety. Patient currently denying any SI/HI here. She also denies any auditory/visual hallucinations. Patient is afebrile, non-toxic appearing, sitting comfortably on examination table. Patient is slightly bradycardic but otherwise vitals are unremarkable. Patient is alert and oriented 3. She is easily conversive and eating lunch as we complete our history. Given IVC initiated, with plan for medical clearance labs and TTS evaluation.  Labs reviewed. Urine pregnancy is negative. Acetaminophen level unremarkable. Salicylate level unremarkable. Rapid urine drug screen positive for opiates, cocaine, and benzos. Ethanol normal level. CMP unremarkable. CBC shows slight anemia but records region that is consistent with previous. She does have slightly low platelets. Records reviewed. She does have previous history of low platelets. EKG shows NSR. Patient medically cleared. Awaiting TTS evaluation.  4:50 PM: Patient is sleeping comfortably on bed. She is easily arousable to verbal stimuli. She wakes up and fully converses with me. I explained to her that she'll be staying for evaluation by TTS. She expresses  understanding and agreement to plan.   Final Clinical Impressions(s) / ED Diagnoses   Final diagnoses:  Polysubstance abuse [facility])    New Prescriptions New Prescriptions   No medications on file     Rosana Hoes 10/08/17 2109    Tilden Fossa, MD 10/10/17 [phone]

## 2017-10-08 NOTE — ED Triage Notes (Signed)
Pa 

## 2017-10-09 DIAGNOSIS — F17210 Nicotine dependence, cigarettes, uncomplicated: Secondary | ICD-10-CM

## 2017-10-09 DIAGNOSIS — F1120 Opioid dependence, uncomplicated: Secondary | ICD-10-CM | POA: Diagnosis present

## 2017-10-09 DIAGNOSIS — F1920 Other psychoactive substance dependence, uncomplicated: Secondary | ICD-10-CM

## 2017-10-09 MED ORDER — GABAPENTIN 300 MG PO CAPS: 300.0000 mg | ORAL_CAPSULE | Freq: Three times a day (TID) | ORAL | 0 refills | Status: DC

## 2017-10-09 NOTE — ED Notes (Signed)
Hourly rounding reveals patient sleeping in room. No complaints, stable, in no acute distress. Q15 minute rounds and monitoring via Security Cameras to continue. 

## 2017-10-09 NOTE — [facility] Suicide Risk Assessment (Signed)
Suicide Risk Assessment  Discharge Assessment   [facility]   Principal Problem: Polysubstance dependence including opioid type drug without complication, continuous use [facility](HCC) Discharge Diagnoses:  Patient Active Problem List   Diagnosis Date Noted  . Polysubstance dependence including opioid type drug without complication, continuous use (HCC) [F19.20] 10/09/2017    Priority: High    Total Time spent with patient: 45 minutes   Musculoskeletal: Strength & Muscle Tone: within normal limits Gait & Station: normal Patient leans: N/A  Psychiatric Specialty Exam: Physical Exam  Constitutional: She is oriented to person, place, and time. She appears well-developed and well-nourished.  HENT:  Head: Normocephalic.  Neck: Normal range of motion.  Respiratory: Effort normal.  Musculoskeletal: Normal range of motion.  Neurological: She is alert and oriented to person, place, and time.  Psychiatric: She has a normal mood and affect. Her speech is normal and behavior is normal. Judgment and thought content normal. Cognition and memory are normal.    Review of Systems  Psychiatric/Behavioral: Positive for substance abuse.  All other systems reviewed and are negative.   Blood pressure 122/89, pulse 65, temperature 97.9 F (36.6 C), temperature source Oral, resp. rate 18, last menstrual period 09/27/2017, SpO2 99 %, unknown if currently breastfeeding.There is no height or weight on file to calculate BMI.  General Appearance: Casual  Eye Contact:  Good  Speech:  Clear and Coherent  Volume:  Normal  Mood:  Euthymic  Affect:  Congruent  Thought Process:  Coherent and Descriptions of Associations: Intact  Orientation:  Full (Time, Place, and Person)  Thought Content:  WDL and Logical  Suicidal Thoughts:  No  Homicidal Thoughts:  No  Memory:  Immediate;   Good Recent;   Good Remote;   Good  Judgement:  Fair  Insight:  Fair  Psychomotor Activity:  Decreased   Concentration:  Concentration: Good and Attention Span: Good  Recall:  Good  Fund of Knowledge:  Fair  Language:  Good  Akathisia:  No  Handed:  Right  AIMS (if indicated):     Assets:  Housing Leisure Time Physical Health Resilience Social Support  ADL's:  Intact  Cognition:  WNL  Sleep:      Mental Status Per Nursing Assessment::   On Admission:   polysubstance abuse  Demographic Factors:  Caucasian  Loss Factors: NA  Historical Factors: NA  Risk Reduction Factors:   Sense of responsibility to family, Living with another person, especially a relative and Positive social support  Continued Clinical Symptoms:  None  Cognitive Features That Contribute To Risk:  None    Suicide Risk:  Minimal: No identifiable suicidal ideation.  Patients presenting with no risk factors but with morbid ruminations; may be classified as minimal risk based on the severity of the depressive symptoms    Plan Of Care/Follow-up recommendations:  Activity:  as tolerated Diet:  heart healthy diet  Valor Quaintance, NP 10/09/2017, 10:48 AM

## 2017-10-09 NOTE — Consult Note (Signed)
[facility] Face-to-Face Psychiatry Consult   Reason for Consult:  Polysubstance abuse Referring Physician:  EDP Patient Identification: Charlotte Mitchell MRN:  [id number] Principal Diagnosis: Polysubstance dependence including opioid type drug without complication, continuous use ([place]) Diagnosis:   Patient Active Problem List   Diagnosis Date Noted  . Polysubstance dependence including opioid type drug without complication, continuous use ([place]) [F19.20] 10/09/2017    Priority: High    Total Time spent with patient: 45 minutes  Subjective:   Charlotte Mitchell is a 26 y.o. female patient does not warrant admission.  HPI:  26 yo female who presented to the ED under IVC reporting she wanted to die.  The patient, however, denies all the allegations upon arrival to the ED and throughout her stay.  No suicidal/homicidal ideations, hallucinations, or withdrawal symptoms.  She is not interested in meeting with Peer Support unless they can help her with getting connected to a [facility], consult placed.  Past Psychiatric History: substance abuse  Risk to Self: NOne Risk to Others: Homicidal Ideation: No Thoughts of Harm to Others: No Current Homicidal Intent: No Current Homicidal Plan: No Access to Homicidal Means: No Identified Victim: NA History of harm to others?: No Assessment of Violence: None Noted Violent Behavior Description: NA Does patient have access to weapons?: No Criminal Charges Pending?: No Does patient have a court date: No Prior Inpatient Therapy: Prior Inpatient Therapy: No Prior Therapy Dates: NA Prior Therapy Facilty/Provider(s): NA Reason for Treatment: NA Prior Outpatient Therapy: Prior Outpatient Therapy: ([facility]) Prior Therapy Dates: NA Prior Therapy Facilty/Provider(s): NA Reason for Treatment: NA Does patient have an ACCT team?: No Does patient have Intensive In-House Services?  : No Does patient have [facility] services? : No Does patient have P4CC services?:  No  Past Medical History:  Past Medical History:  Diagnosis Date  . Anxiety   . No pertinent past medical history   . Substance abuse [facility])     Past Surgical History:  Procedure Laterality Date  . APPENDECTOMY    . CESAREAN SECTION    . TONSILLECTOMY     Family History: No family history on file. Family Psychiatric  History: unknown Social History:  Social History   Substance and Sexual Activity  Alcohol Use No     Social History   Substance and Sexual Activity  Drug Use Yes    Social History   Socioeconomic History  . Marital status: Married    Spouse name: None  . Number of children: None  . Years of education: None  . Highest education level: None  Social Needs  . Financial resource strain: None  . Food insecurity - worry: None  . Food insecurity - inability: None  . Transportation needs - medical: None  . Transportation needs - non-medical: None  Occupational History  . None  Tobacco Use  . Smoking status: Current Every Day Smoker    Packs/day: 0.00    Years: 9.00    Pack years: 0.00    Types: Cigarettes  . Smokeless tobacco: Never Used  Substance and Sexual Activity  . Alcohol use: No  . Drug use: Yes  . Sexual activity: Yes  Other Topics Concern  . None  Social History Narrative  . None   Additional Social History:    Allergies:   Allergies  Allergen Reactions  . Hydrocodone Itching    Labs:  Results for orders placed or performed during the hospital encounter of 10/08/17 (from the past 48 hour(s))  Comprehensive metabolic  panel     Status: Abnormal   Collection Time: 10/08/17  1:15 PM  Result Value Ref Range   Sodium 139 135 - 145 mmol/L   Potassium 3.6 3.5 - 5.1 mmol/L   Chloride 105 101 - 111 mmol/L   CO2 25 22 - 32 mmol/L   Glucose, Bld 102 (H) 65 - 99 mg/dL   BUN 6 6 - 20 mg/dL   Creatinine, Ser 0.76 0.44 - 1.00 mg/dL   Calcium 8.7 (L) 8.9 - 10.3 mg/dL   Total Protein 6.7 6.5 - 8.1 g/dL   Albumin 3.6 3.5 - 5.0 g/dL   AST  32 15 - 41 U/L   ALT 23 14 - 54 U/L   Alkaline Phosphatase 61 38 - 126 U/L   Total Bilirubin 0.4 0.3 - 1.2 mg/dL   GFR calc non Af Amer >60 >60 mL/min   GFR calc Af Amer >60 >60 mL/min    Comment: (NOTE) The eGFR has been calculated using the CKD EPI equation. This calculation has not been validated in all clinical situations. eGFR's persistently <60 mL/min signify possible Chronic Kidney Disease.    Anion gap 9 5 - 15  Ethanol     Status: None   Collection Time: 10/08/17  1:15 PM  Result Value Ref Range   Alcohol, Ethyl (B) <10 <10 mg/dL    Comment:        LOWEST DETECTABLE LIMIT FOR SERUM ALCOHOL IS 10 mg/dL FOR MEDICAL PURPOSES ONLY   Salicylate level     Status: None   Collection Time: 10/08/17  1:15 PM  Result Value Ref Range   Salicylate Lvl <3.4 2.8 - 30.0 mg/dL  Acetaminophen level     Status: Abnormal   Collection Time: 10/08/17  1:15 PM  Result Value Ref Range   Acetaminophen (Tylenol), Serum <10 (L) 10 - 30 ug/mL    Comment:        THERAPEUTIC CONCENTRATIONS VARY SIGNIFICANTLY. A RANGE OF 10-30 ug/mL MAY BE AN EFFECTIVE CONCENTRATION FOR MANY PATIENTS. HOWEVER, SOME ARE BEST TREATED AT CONCENTRATIONS OUTSIDE THIS RANGE. ACETAMINOPHEN CONCENTRATIONS >150 ug/mL AT 4 HOURS AFTER INGESTION AND >50 ug/mL AT 12 HOURS AFTER INGESTION ARE OFTEN ASSOCIATED WITH TOXIC REACTIONS.   cbc     Status: Abnormal   Collection Time: 10/08/17  1:15 PM  Result Value Ref Range   WBC 3.6 (L) 4.0 - 10.5 K/uL   RBC 3.68 (L) 3.87 - 5.11 MIL/uL   Hemoglobin 11.5 (L) 12.0 - 15.0 g/dL   HCT 34.2 (L) 36.0 - 46.0 %   MCV 92.9 78.0 - 100.0 fL   MCH 31.3 26.0 - 34.0 pg   MCHC 33.6 30.0 - 36.0 g/dL   RDW 13.2 11.5 - 15.5 %   Platelets 112 (L) 150 - 400 K/uL    Comment: RESULT REPEATED AND VERIFIED SPECIMEN CHECKED FOR CLOTS PLATELET COUNT CONFIRMED BY SMEAR   Rapid urine drug screen (hospital performed)     Status: Abnormal   Collection Time: 10/08/17  1:20 PM  Result Value  Ref Range   Opiates POSITIVE (A) NONE DETECTED   Cocaine POSITIVE (A) NONE DETECTED   Benzodiazepines POSITIVE (A) NONE DETECTED   Amphetamines NONE DETECTED NONE DETECTED   Tetrahydrocannabinol NONE DETECTED NONE DETECTED   Barbiturates NONE DETECTED NONE DETECTED    Comment:        DRUG SCREEN FOR MEDICAL PURPOSES ONLY.  IF CONFIRMATION IS NEEDED FOR ANY PURPOSE, NOTIFY LAB WITHIN 5 DAYS.  LOWEST DETECTABLE LIMITS FOR URINE DRUG SCREEN Drug Class       Cutoff (ng/mL) Amphetamine      1000 Barbiturate      200 Benzodiazepine   379 Tricyclics       024 Opiates          300 Cocaine          300 THC              50   Pregnancy, urine     Status: None   Collection Time: 10/08/17  2:29 PM  Result Value Ref Range   Preg Test, Ur NEGATIVE NEGATIVE    Current Facility-Administered Medications  Medication Dose Route Frequency Provider Last Rate Last Dose  . dicyclomine (BENTYL) tablet 20 mg  20 mg Oral Q6H PRN Providence Lanius A, PA-C   20 mg at 10/09/17 0936  . gabapentin (NEURONTIN) capsule 300 mg  300 mg Oral TID Patrecia Pour, NP   300 mg at 10/09/17 0931  . hydrOXYzine (ATARAX/VISTARIL) tablet 25 mg  25 mg Oral Q6H PRN Providence Lanius A, PA-C   25 mg at 10/09/17 0973  . loperamide (IMODIUM) capsule 2-4 mg  2-4 mg Oral PRN Providence Lanius A, PA-C      . methocarbamol (ROBAXIN) tablet 500 mg  500 mg Oral Q8H PRN Providence Lanius A, PA-C   500 mg at 10/09/17 0937  . naproxen (NAPROSYN) tablet 500 mg  500 mg Oral BID PRN Providence Lanius A, PA-C   500 mg at 10/09/17 0936  . ondansetron (ZOFRAN-ODT) disintegrating tablet 4 mg  4 mg Oral Q6H PRN Patrecia Pour, NP       Current Outpatient Medications  Medication Sig Dispense Refill  . acetaminophen (TYLENOL) 500 MG tablet Take 500-1,000 mg by mouth every 6 (six) hours as needed for mild pain, moderate pain or headache.    . Ca Carbonate-Mag Hydroxide (ROLAIDS PO) Take 1-2 tablets by mouth 4 (four) times daily as needed.    .  cephALEXin (KEFLEX) 500 MG capsule Take 1 capsule (500 mg total) by mouth 4 (four) times daily. 40 capsule 0  . ciprofloxacin (CIPRO) 500 MG tablet Take 1 tablet (500 mg total) by mouth 2 (two) times daily. (Patient not taking: Reported on 12/27/2015) 20 tablet 0  . gabapentin (NEURONTIN) 300 MG capsule Take 600 mg by mouth 3 (three) times daily.  0  . hydrOXYzine (ATARAX/VISTARIL) 50 MG tablet Take 50 mg by mouth 3 (three) times daily as needed.    . methadone (DOLOPHINE) 10 MG/ML solution Take 90 mg by mouth every morning.     . ondansetron (ZOFRAN ODT) 4 MG disintegrating tablet Take 1 tablet (4 mg total) by mouth every 8 (eight) hours as needed for nausea or vomiting. 10 tablet 1  . promethazine (PHENERGAN) 25 MG tablet Take 1 tablet (25 mg total) by mouth every 6 (six) hours as needed for nausea or vomiting. 20 tablet 1  . QUEtiapine (SEROQUEL) 100 MG tablet Take 100 mg by mouth at bedtime.    . sodium chloride (OCEAN NASAL SPRAY) 0.65 % nasal spray Place 1 spray into the nose as needed for congestion. 30 mL 0  . tobramycin (TOBREX) 0.3 % ophthalmic solution Place 2 drops into the left eye every 4 (four) hours. 5 mL 0  . traMADol (ULTRAM) 50 MG tablet Take 1 tablet (50 mg total) by mouth every 6 (six) hours as needed. 12 tablet 0    Musculoskeletal: Strength &  Muscle Tone: within normal limits Gait & Station: normal Patient leans: N/A  Psychiatric Specialty Exam: Physical Exam  Constitutional: She is oriented to person, place, and time. She appears well-developed and well-nourished.  HENT:  Head: Normocephalic.  Neck: Normal range of motion.  Respiratory: Effort normal.  Musculoskeletal: Normal range of motion.  Neurological: She is alert and oriented to person, place, and time.  Psychiatric: She has a normal mood and affect. Her speech is normal and behavior is normal. Judgment and thought content normal. Cognition and memory are normal.    Review of Systems   Psychiatric/Behavioral: Positive for substance abuse.  All other systems reviewed and are negative.   Blood pressure 122/89, pulse 65, temperature 97.9 F (36.6 C), temperature source Oral, resp. rate 18, last menstrual period 09/27/2017, SpO2 99 %, unknown if currently breastfeeding.There is no height or weight on file to calculate BMI.  General Appearance: Casual  Eye Contact:  Good  Speech:  Clear and Coherent  Volume:  Normal  Mood:  Euthymic  Affect:  Congruent  Thought Process:  Coherent and Descriptions of Associations: Intact  Orientation:  Full (Time, Place, and Person)  Thought Content:  WDL and Logical  Suicidal Thoughts:  No  Homicidal Thoughts:  No  Memory:  Immediate;   Good Recent;   Good Remote;   Good  Judgement:  Fair  Insight:  Fair  Psychomotor Activity:  Decreased  Concentration:  Concentration: Good and Attention Span: Good  Recall:  Good  Fund of Knowledge:  Fair  Language:  Good  Akathisia:  No  Handed:  Right  AIMS (if indicated):     Assets:  Housing Leisure Time Physical Health Resilience Social Support  ADL's:  Intact  Cognition:  WNL  Sleep:        Treatment Plan Summary: Daily contact with patient to assess and evaluate symptoms and progress in treatment, Medication management and Plan polysubstance dependence including opioids with continous use:  -Crisis stabilization -Medication management:  Clonidine opiate withdrawal protocol started along with Gabapentin 300 mg TID for withdrawal symptoms. -Individual and substance abuse counseling -Peer Support referral  Disposition: No evidence of imminent risk to self or others at present.    Waylan Boga, NP 10/09/2017 10:22 AM   Patient seen face-to-face for psychiatric evaluation, chart reviewed and case discussed with the physician extender and developed treatment plan. Reviewed the information documented and agree with the treatment plan.  Buford Dresser, DO

## 2017-10-09 NOTE — ED Notes (Signed)
Pt A&O x 3, no distress noted, calm & cooperative.  Monitoring for safety, Q 15 min checks in effect. 

## 2018-05-19 ENCOUNTER — Emergency Department (HOSPITAL_BASED_OUTPATIENT_CLINIC_OR_DEPARTMENT_OTHER): Payer: Self-pay

## 2018-05-19 ENCOUNTER — Encounter (HOSPITAL_BASED_OUTPATIENT_CLINIC_OR_DEPARTMENT_OTHER): Payer: Self-pay | Admitting: *Deleted

## 2018-05-19 ENCOUNTER — Other Ambulatory Visit: Payer: Self-pay

## 2018-05-19 ENCOUNTER — Emergency Department (HOSPITAL_BASED_OUTPATIENT_CLINIC_OR_DEPARTMENT_OTHER)
Admission: EM | Admit: 2018-05-19 | Discharge: 2018-05-19 | Disposition: A | Discharge status: Home / Self Care | Payer: Self-pay | Attending: Emergency Medicine | Admitting: Emergency Medicine

## 2018-05-19 DIAGNOSIS — R109 Unspecified abdominal pain: Secondary | ICD-10-CM

## 2018-05-19 DIAGNOSIS — F17210 Nicotine dependence, cigarettes, uncomplicated: Secondary | ICD-10-CM | POA: Insufficient documentation

## 2018-05-19 DIAGNOSIS — Z79899 Other long term (current) drug therapy: Secondary | ICD-10-CM | POA: Insufficient documentation

## 2018-05-19 DIAGNOSIS — F1910 Other psychoactive substance abuse, uncomplicated: Secondary | ICD-10-CM | POA: Insufficient documentation

## 2018-05-19 DIAGNOSIS — A599 Trichomoniasis, unspecified: Secondary | ICD-10-CM | POA: Insufficient documentation

## 2018-05-19 LAB — URINALYSIS, MICROSCOPIC (REFLEX)

## 2018-05-19 LAB — URINALYSIS, ROUTINE W REFLEX MICROSCOPIC
Bilirubin Urine: NEGATIVE
GLUCOSE, UA: NEGATIVE mg/dL
HGB URINE DIPSTICK: NEGATIVE
KETONES UR: NEGATIVE mg/dL
Nitrite: NEGATIVE
PROTEIN: NEGATIVE mg/dL
Specific Gravity, Urine: 1.01 (ref 1.005–1.030)
pH: 8 (ref 5.0–8.0)

## 2018-05-19 LAB — CBC WITH DIFFERENTIAL/PLATELET
Basophils Absolute: 0 10*3/uL (ref 0.0–0.1)
Basophils Relative: 0 %
EOS PCT: 1 %
Eosinophils Absolute: 0.1 10*3/uL (ref 0.0–0.7)
HEMATOCRIT: 32.1 % — AB (ref 36.0–46.0)
Hemoglobin: 10.3 g/dL — ABNORMAL LOW (ref 12.0–15.0)
LYMPHS ABS: 1.6 10*3/uL (ref 0.7–4.0)
Lymphocytes Relative: 26 %
MCH: 30.2 pg (ref 26.0–34.0)
MCHC: 32.1 g/dL (ref 30.0–36.0)
MCV: 94.1 fL (ref 78.0–100.0)
MONO ABS: 0.6 10*3/uL (ref 0.1–1.0)
Monocytes Relative: 9 %
Neutro Abs: 3.7 10*3/uL (ref 1.7–7.7)
Neutrophils Relative %: 64 %
PLATELETS: 256 10*3/uL (ref 150–400)
RBC: 3.41 MIL/uL — ABNORMAL LOW (ref 3.87–5.11)
RDW: 13.7 % (ref 11.5–15.5)
WBC: 5.9 10*3/uL (ref 4.0–10.5)

## 2018-05-19 LAB — WET PREP, GENITAL
Sperm: NONE SEEN
YEAST WET PREP: NONE SEEN

## 2018-05-19 LAB — COMPREHENSIVE METABOLIC PANEL
ALT: 9 U/L — AB (ref 14–54)
AST: 14 U/L — ABNORMAL LOW (ref 15–41)
Albumin: 2.9 g/dL — ABNORMAL LOW (ref 3.5–5.0)
Alkaline Phosphatase: 72 U/L (ref 38–126)
Anion gap: 8 (ref 5–15)
BILIRUBIN TOTAL: 0.3 mg/dL (ref 0.3–1.2)
BUN: 7 mg/dL (ref 6–20)
CHLORIDE: 98 mmol/L — AB (ref 101–111)
CO2: 30 mmol/L (ref 22–32)
CREATININE: 0.61 mg/dL (ref 0.44–1.00)
Calcium: 8.5 mg/dL — ABNORMAL LOW (ref 8.9–10.3)
Glucose, Bld: 96 mg/dL (ref 65–99)
POTASSIUM: 4.1 mmol/L (ref 3.5–5.1)
Sodium: 136 mmol/L (ref 135–145)
TOTAL PROTEIN: 7.7 g/dL (ref 6.5–8.1)

## 2018-05-19 LAB — HCG, QUANTITATIVE, PREGNANCY

## 2018-05-19 LAB — LIPASE, BLOOD: LIPASE: 22 U/L (ref 11–51)

## 2018-05-19 LAB — PREGNANCY, URINE: Preg Test, Ur: NEGATIVE

## 2018-05-19 MED ORDER — IOPAMIDOL (ISOVUE-300) INJECTION 61%: 100.0000 mL | Freq: Once | INTRAVENOUS | Status: DC | PRN

## 2018-05-19 MED ORDER — SODIUM CHLORIDE 0.9 % IV BOLUS: 1000.0000 mL | Freq: Once | INTRAVENOUS | Status: DC

## 2018-05-19 MED ORDER — MORPHINE SULFATE (PF) 4 MG/ML IV SOLN
4.0000 mg | Freq: Once | INTRAVENOUS | Status: AC
  Administered 2018-05-19: 4 mg via INTRAVENOUS
  Filled 2018-05-19: qty 1

## 2018-05-19 NOTE — ED Notes (Signed)
Pt crying in room and wanting to leave. Informed pt the tech was outside the door to take her to CT scan, pt refused. Pt reports, "I can do this at home, Im leaving".

## 2018-05-19 NOTE — ED Provider Notes (Signed)
[facility] EMERGENCY DEPARTMENT Provider Note   CSN: [id number] Arrival date & time: 05/19/18  1054     History   Chief Complaint Chief Complaint  Patient presents with  . Back Pain    HPI Charlotte Mitchell is a 27 y.o. female.  HPI   Patient is a 27 year old female who presents the ED today to be evaluated for bilat lower abd pain which as been present for the last week. States pain originally started in the LLQ earlier in the week and has now moved to the RLQ. Intermittently radiates to right back and down RLE. Pain is worse when she lays down, when she bends down, and when she turns a certain way. Pain is constant and severe. Abdominal pain feels sharp and pain in back is throbbing. Denies that back pain pain is midline.  Reports she has recently been constipated and tried to take a laxative yesterday with no relief. States she had to disempact herself yesterday to have a BM. Reports pain with bowel movements. No blood in stool. She denies fevers. No nausea, vomiting. No CP or SOB. Denies dysuria, frequency, hematuria. Denies vaginal discharge, vaginal bleeding, vaginal irritation, or vaginal odor. States she is sexually active and has recently had sexual activity without protection a few days ago.   Endorses a h/o IVDU.  Denies numbness or weakness to legs. No loss of control of bowels or bladder function. Denies a h/o CA. States she used heroin about 20 hours ago. Also endorses using crack yesterday. States her LMP was 1 year ago. States she has the nexplanon, "but its expired". Has had same nexplanon for 6 years.   Past Medical History:  Diagnosis Date  . Anxiety   . No pertinent past medical history   . Substance abuse [facility](HCC)     Patient Active Problem List   Diagnosis Date Noted  . Polysubstance dependence including opioid type drug without complication, continuous use (H[facility]) 10/09/2017    Past Surgical History:  Procedure Laterality Date  . APPENDECTOMY    .  CESAREAN SECTION    . CESAREAN SECTION  02/03/2012   Procedure: CESAREAN SECTION;  Surgeon: Loney LaurenceMichelle A Horvath, MD;  Location: [facility];  Service: Gynecology;  Laterality: N/A;  . TONSILLECTOMY       OB History    Gravida  3   Para  3   Term  3   Preterm      AB      Living  3     SAB      TAB      Ectopic      Multiple      Live Births  3            Home Medications    Prior to Admission medications   Medication Sig Start Date End Date Taking? Authorizing Provider  acetaminophen (TYLENOL) 500 MG tablet Take 500-1,000 mg by mouth every 6 (six) hours as needed for mild pain, moderate pain or headache.    [provider]  gabapentin (NEURONTIN) 300 MG capsule Take 1 capsule (300 mg total) 3 (three) times daily by mouth. 10/09/17   Charm RingsLord, Jamison Y, NP  hydrOXYzine (ATARAX/VISTARIL) 50 MG tablet Take 50 mg 3 (three) times daily as needed by mouth for anxiety.     [provider]    Family History No family history on file.  Social History Social History   Tobacco Use  . Smoking status: Current Every Day Smoker  Packs/day: 0.00    Years: 9.00    Pack years: 0.00    Types: Cigarettes  . Smokeless tobacco: Never Used  Substance Use Topics  . Alcohol use: No  . Drug use: Yes     Allergies   Hydrocodone and Zofran [ondansetron hcl]   Review of Systems Review of Systems  Constitutional: Negative for chills and fever.  HENT: Negative for ear pain and sore throat.   Eyes: Negative for pain and visual disturbance.  Respiratory: Negative for cough and shortness of breath.   Cardiovascular: Negative for chest pain and palpitations.  Gastrointestinal: Positive for abdominal pain and constipation. Negative for blood in stool, diarrhea, nausea and vomiting.       Painful BMs  Genitourinary: Negative for dysuria, flank pain, frequency, hematuria, urgency, vaginal bleeding and vaginal discharge.  Musculoskeletal: Positive for back pain.    Skin: Negative for rash.  Neurological: Negative for headaches.  All other systems reviewed and are negative.    Physical Exam Updated Vital Signs BP 131/87   Pulse 93   Temp 98.5 F (36.9 C) (Oral)   Resp 20   Ht 5\' 4"  (1.626 m)   Wt 56.2 kg (123 lb 14.4 oz)   SpO2 98%   BMI 21.27 kg/m   Physical Exam  Constitutional: She appears well-developed and well-nourished. No distress.  Appears uncomfortable. Standing/pacing the room throughout interview  HENT:  Head: Normocephalic and atraumatic.  Mouth/Throat: Oropharynx is clear and moist.  Eyes: Pupils are equal, round, and reactive to light. Conjunctivae and EOM are normal.  Neck: Neck supple.  Cardiovascular: Normal rate, regular rhythm, normal heart sounds and intact distal pulses.  No murmur heard. Pulmonary/Chest: Effort normal and breath sounds normal. No respiratory distress.  Abdominal: Soft. Bowel sounds are normal.  Mildly distended. Diffuse TTP, worse in the RLQ and right suprapubic area. Right CVA TTP.  Genitourinary:  Genitourinary Comments: Exam performed by Karrie Meres,  exam chaperoned Date: 05/19/2018 Pelvic exam: normal external genitalia without evidence of trauma. VULVA: normal appearing vulva with no masses, tenderness or lesion. VAGINA: normal appearing vagina with normal color and no lesions, white discharge present in the vaginal vault CERVIX: normal appearing cervix without lesions, cervical motion tenderness absent, cervical os closed with white discharge present; Wet prep and DNA probe for chlamydia and GC obtained.   ADNEXA: normal adnexa in size, nontender and no masses UTERUS: uterus is normal size, shape, consistency and nontender.   Musculoskeletal: She exhibits no edema.  No TTP to the cervical, thoracic, or lumbar spine. No pain to the paraspinous muscles. No TTP to the SI joint or along the sciatic notches bilaterally.  Neurological: She is alert.  Motor:  Normal tone. 5/5 strength of  BLE major muscle groups including strong and equal dorsiflexion/plantar flexion Sensory: light touch normal in all extremities. Gait: normal gait and balance.  CV: 2+ radial and DP/PT pulses  Skin: Skin is warm and dry.  Psychiatric: She has a normal mood and affect.  Nursing note and vitals reviewed.  ED Treatments / Results  Labs (all labs ordered are listed, but only abnormal results are displayed) Labs Reviewed  WET PREP, GENITAL - Abnormal; Notable for the following components:      Result Value   Trich, Wet Prep PRESENT (*)    Clue Cells Wet Prep HPF POC PRESENT (*)    WBC, Wet Prep HPF POC MANY (*)    All other components within normal limits  CBC WITH DIFFERENTIAL/PLATELET -  Abnormal; Notable for the following components:   RBC 3.41 (*)    Hemoglobin 10.3 (*)    HCT 32.1 (*)    All other components within normal limits  COMPREHENSIVE METABOLIC PANEL - Abnormal; Notable for the following components:   Chloride 98 (*)    Calcium 8.5 (*)    Albumin 2.9 (*)    AST 14 (*)    ALT 9 (*)    All other components within normal limits  URINALYSIS, ROUTINE W REFLEX MICROSCOPIC - Abnormal; Notable for the following components:   APPearance CLOUDY (*)    Leukocytes, UA TRACE (*)    All other components within normal limits  URINALYSIS, MICROSCOPIC (REFLEX) - Abnormal; Notable for the following components:   Bacteria, UA MANY (*)    All other components within normal limits  URINE CULTURE  LIPASE, BLOOD  PREGNANCY, URINE  HCG, QUANTITATIVE, PREGNANCY  HIV ANTIBODY (ROUTINE TESTING)  RPR  GC/CHLAMYDIA PROBE AMP () NOT AT [facility]    EKG None  Radiology No results found.  Procedures Procedures (including critical care time)  Medications Ordered in ED Medications  sodium chloride 0.9 % bolus 1,000 mL (has no administration in time range)  morphine 4 MG/ML injection 4 mg (4 mg Intravenous Given 05/19/18 1240)     Initial Impression / Assessment and Plan / ED  Course  I have reviewed the triage vital signs and the nursing notes.  Pertinent labs & imaging results that were available during my care of the patient were reviewed by me and considered in my medical decision making (see chart for details).   Re-evaluated pt and completed pelvic exam. She states pain has somewhat improved and she appears to be more comfortable.  Final Clinical Impressions(s) / ED Diagnoses   Final diagnoses:  Trichomonas infection  Abdominal pain, unspecified abdominal location  Substance abuse (HCC)   Pt presenting to the ED complaining of abdominal pain.  Initially started in left lower quadrant however now worse in the right lower quadrant and radiates around her lateral back.  Pain worse on the right side.  Recent history of constipation in the setting of opioid use.  Her vital signs are stable and she is afebrile in the ED.  Nontoxic and nonseptic appearing.   She has diffuse abdominal tenderness with some mild distention.  Tenderness is worse in the right lower quadrant.  Pelvic exam without CMT, uterine, or adnexal TTP. Doubt PID. Intraabdominal pathology more likely at this time.  She has no midline spinal tenderness therefore doubt spinal epidural abscess, discitis, or myelopathy.  She is an IV drug user however has no other red flag signs or symptoms for back pain. Is ambulatory in ED.  CBC without leukocytosis. Mild anemia which appears grossly stable. CMP with normal liver and kidney function. Low albumin. No gross electrolyte derangements. Lipase WNL.  UA with leukocytes, no nitrites. Also with many bacteria and 0-5 WBC on microscopic eval. Culture sent, but suspect leukocytes present 2/2 STD. U preg negative therefore doubt ectopic. Wet prep positive for trichomonas.  CT abd/pelvis ordered however pt eloped prior to undergoing the study.  GC/chalmydia, HIV, RPR, and urine culture pending. Pt eloped prior to me being able to communicate results of wet prep showing  trichomonas.   ED Discharge Orders    None       Rayne Du 05/19/18 1412    Gwyneth Sprout, MD 05/19/18 1435

## 2018-05-19 NOTE — ED Triage Notes (Addendum)
Back pain x 1 week. Left lower quadrant into her leg. Now she feels constipated and has pain in her right lower quadrant. States she has been coughing black mold. Pt has had 43% body weight loss in the past year. 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

## 2018-05-20 LAB — RPR: RPR Ser Ql: NONREACTIVE

## 2018-05-20 LAB — GC/CHLAMYDIA PROBE AMP (~~LOC~~) NOT AT ARMC
Chlamydia: NEGATIVE
Neisseria Gonorrhea: NEGATIVE

## 2018-05-20 LAB — HIV ANTIBODY (ROUTINE TESTING W REFLEX): HIV Screen 4th Generation wRfx: NONREACTIVE

## 2018-05-21 ENCOUNTER — Other Ambulatory Visit: Payer: Self-pay

## 2018-05-21 ENCOUNTER — Encounter (HOSPITAL_BASED_OUTPATIENT_CLINIC_OR_DEPARTMENT_OTHER): Payer: Self-pay | Admitting: Emergency Medicine

## 2018-05-21 DIAGNOSIS — M4648 Discitis, unspecified, sacral and sacrococcygeal region: Secondary | ICD-10-CM | POA: Diagnosis present

## 2018-05-21 DIAGNOSIS — N390 Urinary tract infection, site not specified: Secondary | ICD-10-CM | POA: Diagnosis present

## 2018-05-21 DIAGNOSIS — B9620 Unspecified Escherichia coli [E. coli] as the cause of diseases classified elsewhere: Secondary | ICD-10-CM | POA: Diagnosis present

## 2018-05-21 DIAGNOSIS — G061 Intraspinal abscess and granuloma: Secondary | ICD-10-CM | POA: Diagnosis present

## 2018-05-21 DIAGNOSIS — F17210 Nicotine dependence, cigarettes, uncomplicated: Secondary | ICD-10-CM | POA: Diagnosis present

## 2018-05-21 DIAGNOSIS — M609 Myositis, unspecified: Secondary | ICD-10-CM | POA: Diagnosis present

## 2018-05-21 DIAGNOSIS — Z888 Allergy status to other drugs, medicaments and biological substances status: Secondary | ICD-10-CM

## 2018-05-21 DIAGNOSIS — M8610 Other acute osteomyelitis, unspecified site: Secondary | ICD-10-CM | POA: Diagnosis present

## 2018-05-21 DIAGNOSIS — D649 Anemia, unspecified: Secondary | ICD-10-CM | POA: Diagnosis present

## 2018-05-21 DIAGNOSIS — Z5321 Procedure and treatment not carried out due to patient leaving prior to being seen by health care provider: Secondary | ICD-10-CM | POA: Diagnosis not present

## 2018-05-21 DIAGNOSIS — M4628 Osteomyelitis of vertebra, sacral and sacrococcygeal region: Principal | ICD-10-CM | POA: Diagnosis present

## 2018-05-21 DIAGNOSIS — Z79899 Other long term (current) drug therapy: Secondary | ICD-10-CM

## 2018-05-21 DIAGNOSIS — F1490 Cocaine use, unspecified, uncomplicated: Secondary | ICD-10-CM | POA: Diagnosis present

## 2018-05-21 DIAGNOSIS — F419 Anxiety disorder, unspecified: Secondary | ICD-10-CM | POA: Diagnosis present

## 2018-05-21 DIAGNOSIS — R011 Cardiac murmur, unspecified: Secondary | ICD-10-CM | POA: Diagnosis present

## 2018-05-21 DIAGNOSIS — Z590 Homelessness: Secondary | ICD-10-CM

## 2018-05-21 DIAGNOSIS — F1120 Opioid dependence, uncomplicated: Secondary | ICD-10-CM | POA: Diagnosis present

## 2018-05-21 LAB — URINE CULTURE: Culture: >=100000 — AB

## 2018-05-21 NOTE — ED Triage Notes (Signed)
Note from 6/14 -Back pain x 1 week. Left lower quadrant into her leg. Now she feels constipated and has pain in her right lower quadrant. States she has been coughing black mold. Pt has had 43% body weight loss in the past year. 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 - patient states that she continues to have the pain but she did not get a CT scan

## 2018-05-22 ENCOUNTER — Encounter (HOSPITAL_COMMUNITY): Payer: Self-pay | Admitting: Orthopedic Surgery

## 2018-05-22 ENCOUNTER — Encounter (HOSPITAL_COMMUNITY)
Admission: EM | Discharge status: Against Medical Advice | Payer: Self-pay | Source: Home / Self Care | Attending: Student in an Organized Health Care Education/Training Program

## 2018-05-22 ENCOUNTER — Emergency Department (HOSPITAL_COMMUNITY): Payer: Self-pay

## 2018-05-22 ENCOUNTER — Inpatient Hospital Stay (HOSPITAL_COMMUNITY): Payer: Self-pay

## 2018-05-22 ENCOUNTER — Telehealth: Payer: Self-pay | Admitting: Emergency Medicine

## 2018-05-22 ENCOUNTER — Inpatient Hospital Stay (HOSPITAL_COMMUNITY): Payer: Self-pay | Admitting: Certified Registered Nurse Anesthetist

## 2018-05-22 ENCOUNTER — Emergency Department (HOSPITAL_BASED_OUTPATIENT_CLINIC_OR_DEPARTMENT_OTHER): Payer: Self-pay

## 2018-05-22 ENCOUNTER — Inpatient Hospital Stay (HOSPITAL_BASED_OUTPATIENT_CLINIC_OR_DEPARTMENT_OTHER)
Admission: EM | Admit: 2018-05-22 | Discharge: 2018-05-25 | DRG: 539 | Discharge status: Against Medical Advice | Payer: Self-pay | Attending: Student in an Organized Health Care Education/Training Program | Admitting: Student in an Organized Health Care Education/Training Program

## 2018-05-22 DIAGNOSIS — M609 Myositis, unspecified: Secondary | ICD-10-CM

## 2018-05-22 DIAGNOSIS — R634 Abnormal weight loss: Secondary | ICD-10-CM

## 2018-05-22 DIAGNOSIS — Z590 Homelessness: Secondary | ICD-10-CM

## 2018-05-22 DIAGNOSIS — N390 Urinary tract infection, site not specified: Secondary | ICD-10-CM

## 2018-05-22 DIAGNOSIS — M4627 Osteomyelitis of vertebra, lumbosacral region: Secondary | ICD-10-CM

## 2018-05-22 DIAGNOSIS — F1123 Opioid dependence with withdrawal: Secondary | ICD-10-CM

## 2018-05-22 DIAGNOSIS — F1490 Cocaine use, unspecified, uncomplicated: Secondary | ICD-10-CM

## 2018-05-22 DIAGNOSIS — R011 Cardiac murmur, unspecified: Secondary | ICD-10-CM

## 2018-05-22 DIAGNOSIS — M4647 Discitis, unspecified, lumbosacral region: Secondary | ICD-10-CM

## 2018-05-22 DIAGNOSIS — M4648 Discitis, unspecified, sacral and sacrococcygeal region: Secondary | ICD-10-CM | POA: Diagnosis present

## 2018-05-22 DIAGNOSIS — F1120 Opioid dependence, uncomplicated: Secondary | ICD-10-CM

## 2018-05-22 DIAGNOSIS — M4620 Osteomyelitis of vertebra, site unspecified: Secondary | ICD-10-CM

## 2018-05-22 DIAGNOSIS — G061 Intraspinal abscess and granuloma: Secondary | ICD-10-CM

## 2018-05-22 DIAGNOSIS — A5901 Trichomonal vulvovaginitis: Secondary | ICD-10-CM

## 2018-05-22 DIAGNOSIS — K5903 Drug induced constipation: Secondary | ICD-10-CM

## 2018-05-22 HISTORY — DX: Unspecified convulsions: R56.9

## 2018-05-22 HISTORY — PX: RADIOLOGY WITH ANESTHESIA: SHX6223

## 2018-05-22 LAB — BASIC METABOLIC PANEL
ANION GAP: 8 (ref 5–15)
BUN: 6 mg/dL (ref 6–20)
CO2: 28 mmol/L (ref 22–32)
CREATININE: 0.63 mg/dL (ref 0.44–1.00)
Calcium: 8.5 mg/dL — ABNORMAL LOW (ref 8.9–10.3)
Chloride: 101 mmol/L (ref 101–111)
GFR calc non Af Amer: >60 mL/min (ref >60)
Glucose, Bld: 97 mg/dL (ref 65–99)
Potassium: 4 mmol/L (ref 3.5–5.1)
SODIUM: 137 mmol/L (ref 135–145)

## 2018-05-22 LAB — URINALYSIS, ROUTINE W REFLEX MICROSCOPIC
BILIRUBIN URINE: NEGATIVE
GLUCOSE, UA: NEGATIVE mg/dL
HGB URINE DIPSTICK: NEGATIVE
Ketones, ur: NEGATIVE mg/dL
Nitrite: POSITIVE — AB
Protein, ur: NEGATIVE mg/dL
pH: 6 (ref 5.0–8.0)

## 2018-05-22 LAB — PREGNANCY, URINE: Preg Test, Ur: NEGATIVE

## 2018-05-22 LAB — CBC WITH DIFFERENTIAL/PLATELET
BASOS PCT: 0 %
Basophils Absolute: 0 10*3/uL (ref 0.0–0.1)
Eosinophils Absolute: 0.1 10*3/uL (ref 0.0–0.7)
Eosinophils Relative: 2 %
HCT: 29.6 % — ABNORMAL LOW (ref 36.0–46.0)
HEMOGLOBIN: 9.5 g/dL — AB (ref 12.0–15.0)
Lymphocytes Relative: 40 %
Lymphs Abs: 2.9 10*3/uL (ref 0.7–4.0)
MCH: 30 pg (ref 26.0–34.0)
MCHC: 32.1 g/dL (ref 30.0–36.0)
MCV: 93.4 fL (ref 78.0–100.0)
MONOS PCT: 9 %
Monocytes Absolute: 0.6 10*3/uL (ref 0.1–1.0)
NEUTROS ABS: 3.6 10*3/uL (ref 1.7–7.7)
NEUTROS PCT: 49 %
Platelets: 293 10*3/uL (ref 150–400)
RBC: 3.17 MIL/uL — AB (ref 3.87–5.11)
RDW: 13.6 % (ref 11.5–15.5)
WBC: 7.3 10*3/uL (ref 4.0–10.5)

## 2018-05-22 LAB — URINALYSIS, MICROSCOPIC (REFLEX)

## 2018-05-22 LAB — FERRITIN: FERRITIN: 184 ng/mL (ref 11–307)

## 2018-05-22 SURGERY — MRI WITH ANESTHESIA
Anesthesia: General

## 2018-05-22 MED ORDER — VANCOMYCIN HCL 500 MG IV SOLR
500.0000 mg | Freq: Three times a day (TID) | INTRAVENOUS | Status: DC
  Administered 2018-05-22 – 2018-05-24 (×7): 500 mg via INTRAVENOUS
  Filled 2018-05-22 (×9): qty 500

## 2018-05-22 MED ORDER — IOPAMIDOL (ISOVUE-300) INJECTION 61%
100.0000 mL | Freq: Once | INTRAVENOUS | Status: AC | PRN
  Administered 2018-05-22: 100 mL via INTRAVENOUS

## 2018-05-22 MED ORDER — HYDROMORPHONE HCL 1 MG/ML IJ SOLN
1.0000 mg | Freq: Once | INTRAMUSCULAR | Status: AC
  Administered 2018-05-22: 1 mg via INTRAVENOUS
  Filled 2018-05-22: qty 1

## 2018-05-22 MED ORDER — HYDROMORPHONE HCL 2 MG/ML IJ SOLN: 1.0000 mg | INTRAMUSCULAR | Status: DC | PRN

## 2018-05-22 MED ORDER — ONDANSETRON HCL 4 MG PO TABS: 4.0000 mg | ORAL_TABLET | Freq: Four times a day (QID) | ORAL | Status: DC | PRN

## 2018-05-22 MED ORDER — HYDROMORPHONE HCL 2 MG/ML IJ SOLN
2.0000 mg | Freq: Once | INTRAMUSCULAR | Status: AC
  Administered 2018-05-22: 2 mg via INTRAVENOUS
  Filled 2018-05-22: qty 1

## 2018-05-22 MED ORDER — DEXMEDETOMIDINE HCL 200 MCG/2ML IV SOLN
INTRAVENOUS | Status: DC | PRN
  Administered 2018-05-22 (×3): 8 ug via INTRAVENOUS

## 2018-05-22 MED ORDER — SODIUM CHLORIDE 0.9 % IV BOLUS
1000.0000 mL | Freq: Once | INTRAVENOUS | Status: AC
  Administered 2018-05-22: 1000 mL via INTRAVENOUS

## 2018-05-22 MED ORDER — ACETAMINOPHEN 325 MG PO TABS
650.0000 mg | ORAL_TABLET | Freq: Four times a day (QID) | ORAL | Status: DC | PRN
  Administered 2018-05-24 – 2018-05-25 (×3): 650 mg via ORAL
  Filled 2018-05-22 (×3): qty 2

## 2018-05-22 MED ORDER — GADOBENATE DIMEGLUMINE 529 MG/ML IV SOLN
10.0000 mL | Freq: Once | INTRAVENOUS | Status: AC | PRN
  Administered 2018-05-22: 10 mL via INTRAVENOUS

## 2018-05-22 MED ORDER — PIPERACILLIN-TAZOBACTAM 3.375 G IVPB 30 MIN
3.3750 g | Freq: Once | INTRAVENOUS | Status: AC
  Administered 2018-05-22: 3.375 g via INTRAVENOUS
  Filled 2018-05-22: qty 50

## 2018-05-22 MED ORDER — SODIUM CHLORIDE 0.9 % IV SOLN
1.0000 g | Freq: Once | INTRAVENOUS | Status: AC
  Administered 2018-05-22: 1 g via INTRAVENOUS
  Filled 2018-05-22: qty 10

## 2018-05-22 MED ORDER — PROMETHAZINE HCL 25 MG/ML IJ SOLN
25.0000 mg | Freq: Once | INTRAMUSCULAR | Status: AC
  Administered 2018-05-22: 25 mg via INTRAVENOUS
  Filled 2018-05-22: qty 1

## 2018-05-22 MED ORDER — ONDANSETRON HCL 4 MG/2ML IJ SOLN: 4.0000 mg | Freq: Four times a day (QID) | INTRAMUSCULAR | Status: DC | PRN

## 2018-05-22 MED ORDER — PROPOFOL 10 MG/ML IV BOLUS
INTRAVENOUS | Status: DC | PRN
  Administered 2018-05-22: 160 mg via INTRAVENOUS

## 2018-05-22 MED ORDER — HYDROMORPHONE HCL 2 MG/ML IJ SOLN
1.0000 mg | Freq: Once | INTRAMUSCULAR | Status: AC
  Administered 2018-05-22: 1 mg via INTRAVENOUS
  Filled 2018-05-22: qty 1

## 2018-05-22 MED ORDER — VANCOMYCIN HCL IN DEXTROSE 1-5 GM/200ML-% IV SOLN: 1000.0000 mg | Freq: Once | INTRAVENOUS | Status: DC

## 2018-05-22 MED ORDER — VANCOMYCIN HCL IN DEXTROSE 1-5 GM/200ML-% IV SOLN
1000.0000 mg | Freq: Once | INTRAVENOUS | Status: AC
  Administered 2018-05-22: 1000 mg via INTRAVENOUS
  Filled 2018-05-22: qty 200

## 2018-05-22 MED ORDER — LORAZEPAM 2 MG/ML IJ SOLN
INTRAMUSCULAR | Status: AC
  Filled 2018-05-22: qty 1

## 2018-05-22 MED ORDER — ENOXAPARIN SODIUM 40 MG/0.4ML ~~LOC~~ SOLN
40.0000 mg | SUBCUTANEOUS | Status: DC
  Administered 2018-05-22 – 2018-05-24 (×2): 40 mg via SUBCUTANEOUS
  Filled 2018-05-22 (×2): qty 0.4

## 2018-05-22 MED ORDER — PHENYLEPHRINE 40 MCG/ML (10ML) SYRINGE FOR IV PUSH (FOR BLOOD PRESSURE SUPPORT)
PREFILLED_SYRINGE | INTRAVENOUS | Status: DC | PRN
  Administered 2018-05-22: 120 ug via INTRAVENOUS
  Administered 2018-05-22: 80 ug via INTRAVENOUS

## 2018-05-22 MED ORDER — METRONIDAZOLE 500 MG PO TABS
2000.0000 mg | ORAL_TABLET | Freq: Once | ORAL | Status: AC
  Administered 2018-05-22: 2000 mg via ORAL
  Filled 2018-05-22: qty 4

## 2018-05-22 MED ORDER — POLYETHYLENE GLYCOL 3350 17 G PO PACK: 17.0000 g | PACK | Freq: Every day | ORAL | Status: DC | PRN

## 2018-05-22 MED ORDER — PIPERACILLIN-TAZOBACTAM 3.375 G IVPB
3.3750 g | Freq: Three times a day (TID) | INTRAVENOUS | Status: DC
  Administered 2018-05-22 – 2018-05-23 (×3): 3.375 g via INTRAVENOUS
  Filled 2018-05-22 (×3): qty 50

## 2018-05-22 MED ORDER — LORAZEPAM 2 MG/ML IJ SOLN
2.0000 mg | Freq: Once | INTRAMUSCULAR | Status: AC
  Administered 2018-05-22: 1 mg via INTRAVENOUS

## 2018-05-22 MED ORDER — OXYCODONE-ACETAMINOPHEN 5-325 MG PO TABS: 1.0000 | ORAL_TABLET | ORAL | Status: DC | PRN

## 2018-05-22 MED ORDER — OXYCODONE-ACETAMINOPHEN 5-325 MG PO TABS
1.0000 | ORAL_TABLET | ORAL | Status: DC | PRN
  Administered 2018-05-23: 2 via ORAL
  Filled 2018-05-22: qty 2

## 2018-05-22 MED ORDER — LIDOCAINE 2% (20 MG/ML) 5 ML SYRINGE
INTRAMUSCULAR | Status: DC | PRN
  Administered 2018-05-22: 100 mg via INTRAVENOUS

## 2018-05-22 MED ORDER — ACETAMINOPHEN 650 MG RE SUPP: 650.0000 mg | Freq: Four times a day (QID) | RECTAL | Status: DC | PRN

## 2018-05-22 MED ORDER — VANCOMYCIN HCL 500 MG IV SOLR: 500.0000 mg | Freq: Three times a day (TID) | INTRAVENOUS | Status: DC

## 2018-05-22 MED ORDER — DEXAMETHASONE SODIUM PHOSPHATE 10 MG/ML IJ SOLN
INTRAMUSCULAR | Status: DC | PRN
  Administered 2018-05-22: 5 mg via INTRAVENOUS

## 2018-05-22 MED ORDER — LACTATED RINGERS IV SOLN
INTRAVENOUS | Status: DC
  Administered 2018-05-22: 13:00:00 via INTRAVENOUS

## 2018-05-22 NOTE — ED Provider Notes (Addendum)
  Physical Exam  BP 117/81   Pulse 91   Temp 98.2 F (36.8 C) (Oral)   Resp 20   Ht 5\' 4"  (1.626 m)   Wt 55.8 kg (123 lb)   SpO2 98%   BMI 21.11 kg/m   Physical Exam  ED Course/Procedures     Procedures  MDM   Pt with hx of IVDA comes in with cc of abd pain and back pain. Ct abd shows concerns for diskitis/sacral abscess. MRI pending.  Non radicular pain without any focal neuro deficits.   Derwood KaplanNanavati, Cheyanna Strick, MD 05/22/18 0738  9:18 AM   MRI team just call this.  They stated that patient was not holding still for MRI.  Patient had received 2 mg of Dilaudid prior to her MRI. Patient states that it is hard for her to stay still for 1 hour. We informed the patient the need for her to get a stat MRI given the diagnostic and therapeutic value of the results.  Patient understands the risk of delaying diagnosis, which can lead to permanent paralysis or even death.  She has agreed to try getting MRI one more time.  We will give her 1 mg Dilaudid and 1 mg Ativan at this time.  If this attempt fails then we will have to admit her, and she will have to get the MRI completed under sedation by Anesthesia.   Derwood KaplanNanavati, Laree Garron, MD 05/22/18 0923  9:54 AM Pt couldn't hold still for MRI. She has returned to her bed.    Patient understands that her actions will lead to inadequate medical workup and a delay in diagnosis, and that she is at risk of complications because of her decision, which includes paralysis.  Opportunity to change mind given. Discussion witnessed by RN. Patient is demonstrating good capacity to make decision.  Urine culture is + for e-coli. I think it might be better at the moment to hold off on antibiotics and see if we can get MRI and a proper morphology of the infection.    CRITICAL CARE Performed by: Terell Kincy   Total critical care time: 32 minutes  Critical care time was exclusive of separately billable procedures and treating other  patients.  Critical care was necessary to treat or prevent imminent or life-threatening deterioration.  Critical care was time spent personally by me on the following activities: development of treatment plan with patient and/or surrogate as well as nursing, discussions with consultants, evaluation of patient's response to treatment, examination of patient, obtaining history from patient or surrogate, ordering and performing treatments and interventions, ordering and review of laboratory studies, ordering and review of radiographic studies, pulse oximetry and re-evaluation of patient's condition.     Derwood KaplanNanavati, Adalaya Irion, MD 05/22/18 [phone]

## 2018-05-22 NOTE — ED Provider Notes (Signed)
[facility] Provider Note: [name], MD, FACEP  CSN: [id number] MRN: [id number] ARRIVAL: 05/21/18 at 2240 ROOM: MH02/MH02   CHIEF COMPLAINT  Abdominal Pain   HISTORY OF PRESENT ILLNESS  05/22/18 1:29 AM Charlotte Mitchell is a 27 y.o. female heroin addict with a one-week history of abdominal pain.  She describes the abdominal pain is originating in the lower part of her abdomen and radiating around bilaterally to her back.  It is sometimes dull and sometimes sharp.  She rates it is severe.  It is worse with movement or palpation.  She also complains of abdominal distention.  She was seen for this on the 14th of this month but eloped before getting the results of her work-up.  Review of the results show a wet prep positive for trichomoniasis and other studies negative for gonorrhea, chlamydia, HIV and syphilis.  Her urinalysis was unremarkable but urine culture grew out pansensitive E. coli.  She has been constipated and attempted to take a laxative without adequate relief.  She states she started vomiting yesterday and estimates she has vomited about 10 times.   Past Medical History:  Diagnosis Date  . Anxiety   . No pertinent past medical history   . Substance abuse [facility])     Past Surgical History:  Procedure Laterality Date  . APPENDECTOMY    . CESAREAN SECTION    . CESAREAN SECTION  02/03/2012   Procedure: CESAREAN SECTION;  Surgeon: Loney Laurence, MD;  Location: [facility];  Service: Gynecology;  Laterality: N/A;  . TONSILLECTOMY      History reviewed. No pertinent family history.  Social History   Tobacco Use  . Smoking status: Current Every Day Smoker    Packs/day: 0.00    Years: 9.00    Pack years: 0.00    Types: Cigarettes  . Smokeless tobacco: Never Used  Substance Use Topics  . Alcohol use: No  . Drug use: Yes    Prior to Admission medications   Medication Sig Start Date End Date Taking? Authorizing Provider  acetaminophen (TYLENOL) 500 MG  tablet Take 500-1,000 mg by mouth every 6 (six) hours as needed for mild pain, moderate pain or headache.    [provider]  gabapentin (NEURONTIN) 300 MG capsule Take 1 capsule (300 mg total) 3 (three) times daily by mouth. 10/09/17   Charm Rings, NP  hydrOXYzine (ATARAX/VISTARIL) 50 MG tablet Take 50 mg 3 (three) times daily as needed by mouth for anxiety.     [provider]    Allergies Hydrocodone and Zofran [ondansetron hcl]   REVIEW OF SYSTEMS  Negative except as noted here or in the History of Present Illness.   PHYSICAL EXAMINATION  Initial Vital Signs Blood pressure 127/84, pulse 78, temperature 98.2 F (36.8 C), temperature source Oral, resp. rate 20, height 5\' 4"  (1.626 m), weight 55.8 kg (123 lb), SpO2 100 %, unknown if currently breastfeeding.  Examination General: Well-developed, well-nourished female in no acute distress; appearance consistent with age of record HENT: normocephalic; atraumatic Eyes: pupils equal, round and reactive to light; extraocular muscles intact Neck: supple Heart: regular rate and rhythm Lungs: clear to auscultation bilaterally Abdomen: soft; distended; few sleep tender most prominent in the suprapubic region; bowel sounds present Extremities: No deformity; full range of motion; pulses normal Neurologic: Awake, alert and oriented; motor function intact in all extremities and symmetric; no facial droop Skin: Warm and dry; track marks on arms Psychiatric: Anxious   RESULTS  Summary of this visit's results, reviewed by myself:   EKG Interpretation  Date/Time:    Ventricular Rate:    PR Interval:    QRS Duration:   QT Interval:    QTC Calculation:   R Axis:     Text Interpretation:        Laboratory Studies: Results for orders placed or performed during the hospital encounter of 05/22/18 (from the past 24 hour(s))  Urinalysis, Routine w reflex microscopic     Status: Abnormal   Collection Time: 05/22/18 12:33  AM  Result Value Ref Range   Color, Urine YELLOW YELLOW   APPearance CLOUDY (A) CLEAR   Specific Gravity, Urine >1.030 (H) 1.005 - 1.030   pH 6.0 5.0 - 8.0   Glucose, UA NEGATIVE NEGATIVE mg/dL   Hgb urine dipstick NEGATIVE NEGATIVE   Bilirubin Urine NEGATIVE NEGATIVE   Ketones, ur NEGATIVE NEGATIVE mg/dL   Protein, ur NEGATIVE NEGATIVE mg/dL   Nitrite POSITIVE (A) NEGATIVE   Leukocytes, UA SMALL (A) NEGATIVE  Pregnancy, urine     Status: None   Collection Time: 05/22/18 12:33 AM  Result Value Ref Range   Preg Test, Ur NEGATIVE NEGATIVE  Urinalysis, Microscopic (reflex)     Status: Abnormal   Collection Time: 05/22/18 12:33 AM  Result Value Ref Range   RBC / HPF 0-5 0 - 5 RBC/hpf   WBC, UA 11-20 0 - 5 WBC/hpf   Bacteria, UA MANY (A) NONE SEEN   Squamous Epithelial / LPF 11-20 0 - 5   Mucus PRESENT    Ca Oxalate Crys, UA PRESENT   CBC with Differential/Platelet     Status: Abnormal   Collection Time: 05/22/18  2:30 AM  Result Value Ref Range   WBC 7.3 4.0 - 10.5 K/uL   RBC 3.17 (L) 3.87 - 5.11 MIL/uL   Hemoglobin 9.5 (L) 12.0 - 15.0 g/dL   HCT 16.1 (L) 09.6 - 04.5 %   MCV 93.4 78.0 - 100.0 fL   MCH 30.0 26.0 - 34.0 pg   MCHC 32.1 30.0 - 36.0 g/dL   RDW 40.9 81.1 - 91.4 %   Platelets 293 150 - 400 K/uL   Neutrophils Relative % 49 %   Neutro Abs 3.6 1.7 - 7.7 K/uL   Lymphocytes Relative 40 %   Lymphs Abs 2.9 0.7 - 4.0 K/uL   Monocytes Relative 9 %   Monocytes Absolute 0.6 0.1 - 1.0 K/uL   Eosinophils Relative 2 %   Eosinophils Absolute 0.1 0.0 - 0.7 K/uL   Basophils Relative 0 %   Basophils Absolute 0.0 0.0 - 0.1 K/uL  Basic metabolic panel     Status: Abnormal   Collection Time: 05/22/18  2:30 AM  Result Value Ref Range   Sodium 137 135 - 145 mmol/L   Potassium 4.0 3.5 - 5.1 mmol/L   Chloride 101 101 - 111 mmol/L   CO2 28 22 - 32 mmol/L   Glucose, Bld 97 65 - 99 mg/dL   BUN 6 6 - 20 mg/dL   Creatinine, Ser 7.82 0.44 - 1.00 mg/dL   Calcium 8.5 (L) 8.9 - 10.3  mg/dL   GFR calc non Af Amer >60 >60 mL/min   GFR calc Af Amer >60 >60 mL/min   Anion gap 8 5 - 15   Imaging Studies: Ct Abdomen Pelvis W Contrast  Result Date: 05/22/2018 CLINICAL DATA:  Back pain for 1 week. Left lower quadrant pain radiating to the leg. Now with constipation and  right lower quadrant. Coughing black mold. 43% body weight loss over the past year. Heroin addiction. EXAM: CT ABDOMEN AND PELVIS WITH CONTRAST TECHNIQUE: Multidetector CT imaging of the abdomen and pelvis was performed using the standard protocol following bolus administration of intravenous contrast. CONTRAST:  100mL ISOVUE-300 IOPAMIDOL (ISOVUE-300) INJECTION 61% COMPARISON:  05/03/2016 FINDINGS: Lower chest: Dependent atelectasis in the lung bases. Hepatobiliary: No focal liver abnormality is seen. No gallstones, gallbladder wall thickening, or biliary dilatation. Pancreas: Unremarkable. No pancreatic ductal dilatation or surrounding inflammatory changes. Spleen: Spleen is enlarged. Calcified granulomas in the spleen. No other focal lesions. Prominent splenic vein varices. Adrenals/Urinary Tract: Adrenal glands are unremarkable. Kidneys are normal, without renal calculi, focal lesion, or hydronephrosis. Bladder is unremarkable. Stomach/Bowel: Stomach and small bowel are decompressed. Prominent gas and stool in the right colon and transverse colon with extension to the descending colon. Rectosigmoid colon is decompressed. This is probably due to constipation but a mass or stricture at the junction of the descending and sigmoid colon could have this appearance although unlikely in a patient of this age. No wall thickening is appreciated. Vascular/Lymphatic: Scattered calcifications in the aorta. No significant lymphadenopathy. Reproductive: Uterus and ovaries are not enlarged. Other: No free air or free fluid in the abdomen. Abdominal wall musculature appears intact. Musculoskeletal: Since the previous study, there is widening  of the S1-2 vertebral interspace with mild endplate sclerosis. There is interval development of increased soft tissue anterior to the sacrum. This appearance is suspicious for discitis with anterior paraspinal soft tissue abscess. Suggest MRI lumbosacral spine with contrast for further evaluation. IMPRESSION: 1. Nonspecific prominence of the right colon and transverse colon most likely due to constipation. 2. Widening of the S1-2 vertebral interspace with mild endplate sclerosis and anterior soft tissue prominence, new since previous study. Appearance is suspicious for discitis with anterior paraspinal soft tissue abscess. Suggest MRI lumbosacral spine with contrast for further evaluation. 3. Splenic enlargement with prominent splenic vein varices. Electronically Signed   By: Burman NievesWilliam  Stevens M.D.   On: 05/22/2018 03:14    ED COURSE and MDM  Nursing notes and initial vitals signs, including pulse oximetry, reviewed.  Vitals:   05/21/18 2246 05/22/18 0052 05/22/18 0252  BP: (!) 132/96 127/84 119/62  Pulse: 95 78 96  Resp: 20 20   Temp: 97.9 F (36.6 C) 98.2 F (36.8 C)   TempSrc: Oral Oral   SpO2: 100% 100% 98%  Weight: 55.8 kg (123 lb)    Height: 5\' 4"  (1.626 m)     3:33 AM Rocephin and vancomycin given for UTI and possible septic discitis with prevertebral abscess.  Dr. Blinda LeatherwoodPollina [facility].  3:40 AM Flagyl ordered for treatment of trichomoniasis diagnosed on previous visit.   PROCEDURES   CRITICAL CARE Performed by: Paula LibraMOLPUS,Tiane Szydlowski L Total critical care time: 30 minutes Critical care time was exclusive of separately billable procedures and treating other patients. Critical care was necessary to treat or prevent imminent or life-threatening deterioration. Critical care was time spent personally by me on the following activities: development of treatment plan with patient and/or surrogate as well as nursing, discussions with consultants,  evaluation of patient's response to treatment, examination of patient, obtaining history from patient or surrogate, ordering and performing treatments and interventions, ordering and review of laboratory studies, ordering and review of radiographic studies, pulse oximetry and re-evaluation of patient's condition.   ED DIAGNOSES     ICD-10-CM   1. 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 Lower urinary tract infectious disease N39.0   3. Drug-induced constipation K59.03   4. 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 Paraspinal abscess (HCC) M46.20   6. Trichomoniasis of vagina A59.01        Cutler Sunday, MD 05/22/18 [phone]

## 2018-05-22 NOTE — ED Notes (Signed)
Given ginger ale and saltine crackers with flagyl

## 2018-05-22 NOTE — Progress Notes (Signed)
Pharmacy Antibiotic Note  Charlotte Mitchell is a 27 y.o. female admitted on 05/22/2018 with suspected spinal abcess. Patient with history of IVDU. Pharmacy has been consulted for vancomycin and Zosyn dosing.  Plan: Vancomycin 1000mg  IV once then 500mg  IV every 8 hours.  Goal trough 15-20 mcg/mL. Zosyn 3.375g IV q8h (4 hour infusion). Monitor clinical progression and LOT   Height: 5\' 4"  (162.6 cm) Weight: 123 lb (55.8 kg) IBW/kg (Calculated) : 54.7  Temp (24hrs), Avg:98.1 F (36.7 C), Min:97.9 F (36.6 C), Max:98.2 F (36.8 C)  Recent Labs  Lab 05/19/18 1201 05/22/18 0230  WBC 5.9 7.3  CREATININE 0.61 0.63    Estimated Creatinine Clearance: 89.6 mL/min (by C-G formula based on SCr of 0.63 mg/dL).    Allergies  Allergen Reactions  . Hydrocodone Itching  . Zofran [Ondansetron Hcl] Other (See Comments)    "Blacked out for 6 hours"    Thank you for allowing pharmacy to be a part of this patient's care.  Toniann Failony L Robyne Matar 05/22/2018 10:41 AM

## 2018-05-22 NOTE — Transfer of Care (Signed)
Immediate Anesthesia Transfer of Care Note  Patient: Charlotte Mitchell  Procedure(s) Performed: MRI WITH ANESTHESIA (N/A )  Patient Location: PACU  Anesthesia Type:General  Level of Consciousness: sedated and responds to stimulation  Airway & Oxygen Therapy: Patient Spontanous Breathing  Post-op Assessment: Report given to RN, Post -op Vital signs reviewed and stable and Patient moving all extremities  Post vital signs: Reviewed and stable  Last Vitals:  Vitals Value Taken Time  BP 110/69 05/22/2018  2:59 PM  Temp    Pulse 78 05/22/2018  3:02 PM  Resp 18 05/22/2018  3:02 PM  SpO2 93 % 05/22/2018  3:02 PM  Vitals shown include unvalidated device data.  Last Pain:  Vitals:   05/22/18 1158  TempSrc: Oral  PainSc: 9          Complications: No apparent anesthesia complications

## 2018-05-22 NOTE — ED Notes (Signed)
Pt updated as to urine results and was reassured. Appreciative of information.

## 2018-05-22 NOTE — Discharge Summary (Signed)
Name: Charlotte Mitchell MRN: [id number] DOB: 05-12-1991 27 y.o. PCP: Patient, No Pcp Per  Date of Admission: 05/22/2018 12:29 AM Date of Discharge: 05/25/2018 Attending Physician: Erlinda HongVincent, Duncan  Discharge Diagnosis: Severe opioid use disorder Osteomyelitis of the sacral spine Myositis Abscess anterior to sacrum  Discharge Medications: Allergies as of 05/25/2018      Reactions   Hydrocodone Itching   Zofran [ondansetron Hcl] Other (See Comments)   "Blacked out for 6 hours"      Medication List    TAKE these medications   ciprofloxacin 750 MG tablet Commonly known as:  CIPRO Take 1 tablet (750 mg total) by mouth 2 (two) times daily.   doxycycline 100 MG tablet Commonly known as:  VIBRA-TABS Take 1 tablet (100 mg total) by mouth 2 (two) times daily.   gabapentin 300 MG capsule Commonly known as:  NEURONTIN Take 1 capsule (300 mg total) by mouth 3 (three) times daily.       Disposition and follow-up:   [name] was discharged from [facility] in Serious condition.  At the hospital follow up visit please address:  Severe opioid use disorder  -ensure pt taking suboxone and following up in OUD clinic -ensure pt plugged into community programs to help with OUD information particularly the [employer] program   Osteomyelitis of the sacral spine Myositis Abscess anterior to sacrum  -ensure pt picked up her oral antibiotics and is taking them appropriately -ensure pt using clean needle practices -coordinate second dose of oritavancin if possible, may be difficult due to lack of insurance  2.  Labs / imaging needed at time of follow-up: cbc, bmp  3.  Pending labs/ test needing follow-up: blood culture finilization  Follow-up Appointments: Follow-up Information    Tyson AliasVincent, Duncan Thomas, MD Follow up in 1 week(s).   Specialty:  Internal Medicine Why:  Please follow up in our opioid use disorder clinic in one week Contact information: [address] [place] [address] [phone]           Hospital Course by problem list:  27 year old person who uses IV drugs presents with 4-day history of generalized abdominal pain, emesis and nausea, low back pain.  She was using heroin and crack cocaine without changing out needles but denies sharing needles.  She had a concerning CT abdomen and pelvis that was suggestive of discitis at the S1 and to level with possible anterior paraspinal soft tissue abscess.  She was transferred to [facility] for a MRI to further elucidate her disease process.  She arrived with normal vitals, MRI was ordered and was attempted twice unsuccessfully, she was given 2 mg of IV Dilaudid followed by a 1 mg IV Dilaudid with 1 mg of Ativan after this failure the patient was then admitted for MRI under general anesthesia.  She was started on IV vancomycin, metronidazole and ceftriaxone, the latter two antibiotics were switched to Zosyn.  ID was consulted they switched the patient to vancomycin and ceftriaxone.  Additionally we consulted neurosurgery who felt there was no surgical intervention necessary and IR who felt the abscess was not accessible with any degree of safety.  Therefore we continued empiric treatment.  Additionally we were able to quickly transition the patient to suboxone as her pain improved.  Unfortunately after about 3 days of IV antibiotic therapy the patient was getting very anxious to leave the hospital.  We discussed with her the importance of staying and finishing her IV  antibiotic course but she felt 6 weeks was much too long for her to bear.  We then asked that she would allow [place] as many days as possible to give her the IV antibiotic therapy and she said she would think about this.  Unfortunately, she ultimately decided to leave against medical advice after 4 days of IV therapy.  In anticipation we were able to coordinate a single dose of oritavancin before discharge.  She was also given 2  months of oral antibiotic prescriptions and a one week suboxone prescription with instructions to follow up in our opioid use disorder clinic in one week.  She was also given information on local community support programs such as [facility]STOP including needle exchange program, counseling, narcan.  Discharge Vitals:   BP 109/76 (BP Location: Left Arm)   Pulse 63   Temp 98 F (36.7 C) (Oral)   Resp 17   Ht 5\' 4"  (1.626 m)   Wt 125 lb 14.1 oz (57.1 kg)   SpO2 100%   BMI 21.61 kg/m   Pertinent Labs, Studies, and Procedures:  Component 3d ago  Specimen Description BLOOD LEFT FOREARM   Special Requests BOTTLES DRAWN AEROBIC AND ANAEROBIC Blood Culture adequate volume   Culture NO GROWTH 2 DAYS  Performed at [facility], [address], [place], [place] [address]     Report Status PENDING     CLINICAL DATA:  Fever and back pain. Intravenous drug user. Abnormal CT scan with discitis dated 05/22/2018.  EXAM: MRI SACRUM WITHOUT AND WITH CONTRAST  TECHNIQUE: Multiplanar and multiecho pulse sequences of the sacrum were obtained without and with intravenous contrast.  CONTRAST:  10mL MULTIHANCE GADOBENATE DIMEGLUMINE 529 MG/ML IV SOLN  COMPARISON:  CT scans dated 05/22/2018 and 05/03/2016  SACRUM MRI  FINDINGS: There is discitis at S1-2 with a presacral abscess with abnormal soft tissue and abnormal enhancement extending into the neural foramina bilaterally, more prominent on the left than the right. Abnormal edema and enhancement in the S1, S2 and S3 segments.  Abnormal patchy enhancement of the entire left ileus psoas muscle. Abnormal enhancement of the soft tissues in the neural foramina and spinal canal at S1-2 without a discrete epidural abscess at this time.  Nonspecific free fluid in the pelvic cul-de-sac. This could be normal for a patient of this age.  Abnormal low signal intensity from all the bones visualized on this exam consistent with red marrow  prominence.  IMPRESSION SACRUM:  1. Osteomyelitis at S1, S2, and S3 with adjacent cellulitis in the soft tissues. 2. Small complex multi loculated presacral abscess measuring approximately 5 x 4 x 1 cm, associated with the discitis at S1-2. 3. Severe myositis of the left iliopsoas muscle. 4. Abnormal enhancement of the epidural soft tissues at S1-2 consistent with cellulitis but without epidural abscess.   Electronically Signed   By: Francene Boyers M.D.   On: 05/22/2018 17:39  Study Result   Result status: Final result                              *[place]*                   *[facility]*                         [address]  [place], [place] [address]                            [phone]  ------------------------------------------------------------------- Transthoracic Echocardiography  Patient:    Charlotte Mitchell, Charlotte Mitchell MR #:       [id number] Study Date: 05/23/2018 Gender:     F Age:        27 Height:     162.6 cm Weight:     57.1 kg BSA:        1.61 m^2 Pt. Status: Room:       6N27C   PERFORMING   [facility], Inpatient  ADMITTING    Tyson Alias  ATTENDING    Erlinda Hong Irven Coe, Duncan [facility], Duncan Derrel Nip, Coralee North  SONOGRAPHER  Dance, Tiffany  cc:  ------------------------------------------------------------------- LV EF: 60% -   65%  ------------------------------------------------------------------- Indications:      Murmur 785.2.  ------------------------------------------------------------------- History:   Risk factors:  Substance abuse.  ------------------------------------------------------------------- Study Conclusions  - Left ventricle: The cavity size was normal. Wall thickness was   normal. Systolic function was normal. The estimated ejection   fraction was in the range of 60% to 65%. Wall motion was normal;   there  were no regional wall motion abnormalities. Left   ventricular diastolic function parameters were normal. - Mitral valve: Mildly thickened leaflets . There was trivial   regurgitation. - Left atrium: The atrium was normal in size. - Inferior vena cava: The vessel was dilated >2.1 cm, but collapses   >50% -this may be a normal finding given age.  Impressions:  - Essentially normal study.  ------------------------------------------------------------------- Study data:  No prior study was available for comparison.  Study status:  Routine.  Procedure:  The patient reported no pain pre or post test. Transthoracic echocardiography. Image quality was adequate.  Study completion:  There were no complications. Transthoracic echocardiography.  M-mode, complete 2D, spectral Doppler, and color Doppler.  Birthdate:  Patient birthdate: March 08, 1991.  Age:  Patient is 27 yr old.  Sex:  Gender: female. BMI: 21.6 kg/m^2.  Blood pressure:     118/71  Patient status: Inpatient.  Study date:  Study date: 05/23/2018. Study time: 09:27 AM.  Location:  Bedside.  -------------------------------------------------------------------  ------------------------------------------------------------------- Left ventricle:  The cavity size was normal. Wall thickness was normal. Systolic function was normal. The estimated ejection fraction was in the range of 60% to 65%. Wall motion was normal; there were no regional wall motion abnormalities. The transmitral flow pattern was normal. The deceleration time of the early transmitral flow velocity was normal. The pulmonary vein flow pattern was normal. The tissue Doppler parameters were normal. Left ventricular diastolic function parameters were normal.  ------------------------------------------------------------------- Aortic valve:  Poorly visualized.  Doppler:   There was no stenosis.   There was no  regurgitation.  ------------------------------------------------------------------- Aorta:  Aortic root: The aortic root was normal in size. Ascending aorta: The ascending aorta was normal in size.  ------------------------------------------------------------------- Mitral valve:   Mildly thickened leaflets .  Doppler:  There was trivial regurgitation.    Indexed valve area by pressure half-time: 0.98 cm^2/m^2.    Peak gradient (D): 5 mm Hg.  ------------------------------------------------------------------- Left atrium:  The atrium was normal in size.  ------------------------------------------------------------------- Atrial septum:  No defect or patent foramen ovale was identified.   ------------------------------------------------------------------- Right ventricle:  The cavity size was normal. Wall thickness  was normal. Systolic function was normal.  ------------------------------------------------------------------- Pulmonic valve:    The valve appears to be grossly normal. Doppler:  There was no significant regurgitation.  ------------------------------------------------------------------- Tricuspid valve:   Doppler:  There was no significant regurgitation.  ------------------------------------------------------------------- Pulmonary artery:   The main pulmonary artery was normal-sized.  ------------------------------------------------------------------- Right atrium:  The atrium was normal in size.  ------------------------------------------------------------------- Pericardium:  There was no pericardial effusion.  ------------------------------------------------------------------- Systemic veins: Inferior vena cava: The vessel was dilated >2.1 cm, but collapses >50% -this may be a normal finding given age.  ------------------------------------------------------------------- Measurements   Left ventricle                          Value           Reference  LV ID, ED, PLAX chordal                 45    mm       43 - 52  LV ID, ES, PLAX chordal                 29    mm       23 - 38  LV fx shortening, PLAX chordal          36    %        >=29  LV PW thickness, ED                     9     mm       ----------  IVS/LV PW ratio, ED                     1              <=1.3  LV e&', lateral                          14.7  cm/s     ----------  LV E/e&', lateral                        7.28           ----------  LV e&', medial                           13.4  cm/s     ----------  LV E/e&', medial                         7.99           ----------  LV e&', average                          14.05 cm/s     ----------  LV E/e&', average                        7.62           ----------    Ventricular septum                      Value          Reference  IVS thickness, ED  9     mm       ----------    LVOT                                    Value          Reference  LVOT ID, S                              18    mm       ----------  LVOT area                               2.54  cm^2     ----------    Aorta                                   Value          Reference  Aortic root ID, ED                      26    mm       ----------  Ascending aorta ID, A-P, S              28    mm       ----------    Left atrium                             Value          Reference  LA ID, A-P, ES                          36    mm       ----------  LA ID/bsa, A-P                  (H)     2.24  cm/m^2   <=2.2  LA volume, S                            54.6  ml       ----------  LA volume/bsa, S                        34    ml/m^2   ----------  LA volume, ES, 1-p A4C                  50.9  ml       ----------  LA volume/bsa, ES, 1-p A4C              31.7  ml/m^2   ----------  LA volume, ES, 1-p A2C                  51.2  ml       ----------  LA volume/bsa, ES, 1-p A2C              31.8  ml/m^2   ----------    Mitral valve  Value          Reference  Mitral E-wave peak velocity             107   cm/s     ----------  Mitral A-wave peak velocity             82.5  cm/s     ----------  Mitral deceleration time                194   ms       150 - 230  Mitral pressure half-time               57    ms       ----------  Mitral peak gradient, D                 5     mm Hg    ----------  Mitral E/A ratio, peak                  1.3            ----------  Mitral valve area/bsa, PHT, DP          0.98  cm^2/m^2 ----------    Right atrium                            Value          Reference  RA ID, S-I, ES, A4C                     44.4  mm       34 - 49  RA area, ES, A4C                        11.8  cm^2     8.3 - 19.5  RA volume, ES, A/L                      25    ml       ----------  RA volume/bsa, ES, A/L                  15.6  ml/m^2   ----------    Right ventricle                         Value          Reference  RV ID, minor axis, ED, A4C base         27    mm       ----------  TAPSE                                   27.3  mm       ----------  RV s&', lateral, S                       15    cm/s     ----------  Legend: (L)  and  (H)  mark values outside specified reference range.  ------------------------------------------------------------------- Prepared and Electronically Authenticated by  Zoila Shutter MD 2019-06-18T15:11:54     Discharge Instructions: Discharge Instructions    Discharge instructions   Complete by:  As directed    Please follow up in our  opioid use disorder clinic to fill your suboxone prescription and continue working on staying off opiates.  It will be important to continue the oral antibiotics prescribed and finish the 2 month course.  It is possible that you may be able to get another injection of one of the antibiotics in about 10 days.   I have refilled a prescription of your gabapentin to help with your pain and discomfor with the understanding that it has helped you stay off  opioids in the past as well by suppressing some withdrawal symptoms.      Signed: Angelita Ingles, MD 05/26/2018, 10:49 AM

## 2018-05-22 NOTE — ED Notes (Signed)
Rec'd several calls from MRI regarding patient's inability to cooperate with exam. Pt has rec'd medication and stated to MD that she wld perform scan if she "was sleeping." Pt rec'd additional 1mg  of hydromorphine and 1mg  of ativan

## 2018-05-22 NOTE — ED Notes (Signed)
Back from radiology. Alert, NAD, calm, restless, interactive, resps e/u, speaking in clear complete sentences, no dyspnea noted, skin W&D, VSS, c/o pain and mild nausea.

## 2018-05-22 NOTE — ED Triage Notes (Signed)
TC from MRI Dept. Possible MRI with anesthesia. MRI Dept will call back with update.

## 2018-05-22 NOTE — H&P (Addendum)
Date: 05/22/2018               Patient Name:  Charlotte Mitchell MRN: [id number]  DOB: [birth year] Age / Sex: 27 y.o., female   PCP: Patient, No Pcp Per         Medical Service: Internal Medicine Teaching Service         Attending Physician: Dr. Oswaldo DoneVincent, Marquita Palmsuncan Thomas, *    First Contact: Dr. Frances FurbishWinfrey Pager: [phone]  Second Contact: Dr. Obie DredgeBlum Pager: [phone]       After Hours (After 5p/  First Contact Pager: [phone]  weekends / holidays): Second Contact Pager: [phone]   Chief Complaint: Abdominal and back pain   History of Present Illness: Patient is a 27 year old woman who [facility] with 4 days of generalized abdominal pain, nausea, one episode of emesis, and lower back pain. She is a person who injects heroin and cocaine several times a day. She has had opioid use disorder since age 27 and has been injecting heroin for over one year. She injects 3-4 times daily, usually around 1 gram. She denies a history of infectious complications. She has never had back pain like this before. She denies any numbness or tingling in her legs. She endorses some general leg weakness earlier, but now it is much better. She does reuse needles but denies sharing needles with other people. She endorses unintentional weight loss, she attributes this to poor food security stemming from homelessness.    CT abdomen & pelvis demonstrated findings concerning for S1-2 discitis with anterior paraspinal soft tissue abscess. She was [facility] ED for follow up MRI. MRI lumbar spine and sacrum was unable to be obtained due to patient inability to sit still from pain. MRI was attempted x 2 unsuccessfully, first time with 2 mg IV dilaudid and second attempt with 1 mg IV dilaudid and 1 mg ativan. She was admitted for MRI under general anesthesia for further work up.    Meds: No chronic outpatient medications   Allergies: Allergies as of 05/21/2018 - Review Complete 05/21/2018  Allergen  Reaction Noted  . Hydrocodone Itching 09/30/2014  . Zofran [ondansetron hcl] Other (See Comments) 10/09/2017   Past Medical History:  Diagnosis Date  . Anxiety   . No pertinent past medical history   . Substance abuse (HCC)     Family History:  None  Social History: She is unhoused for the last year. Reports rare alcohol use, smokes cigarettes.   Review of Systems: A complete ROS was negative except as per HPI.   Physical Exam: Blood pressure 115/68, pulse 85, temperature 98.2 F (36.8 C), temperature source Oral, resp. rate 16, height 5\' 4"  (1.626 m), weight 123 lb (55.8 kg), SpO2 98 %, unknown if currently breastfeeding. Constitutional: Chronically ill appearing young woman.  HEENT: Atraumatic, normocephalic. PERRL, anicteric sclera.  Neck: Supple, trachea midline.  Cardiovascular: RRR, 2/6 holosystolic murmur at the LLSB Pulmonary/Chest: CTAB, no wheezes, rales, or rhonchi.   Abdominal: Soft, mildly tender to palpation, non distended. +BS.  Extremities: Warm and well perfused. No edema.  Neurological: Alert and oriented, conversational today, normal strength in upper and lower extremities, normal sensation throughout Skin: No rashes or erythema  Psychiatric: unable to assess   EKG: personally reviewed my interpretation is NSR    Assessment & Plan by Problem:  Patient is a 27 year old PWID being admitted for acute osteomyelitis, discitis and adjacent abscess at L3-4 and  S1-3.    Acute Osteomyelitis/Discitis of Lumbar and Sacral bodies: HIV negative. This is an infectious complication of injection drug use. Will need prolonged IV antibiotics. We will consult with neurosurgery team, but her exam is reassuring with no neuro deficits and I don't think she would require surgical intervention right now. She has extensive myositis, but CK is normal. [facility] ED did not obtain blood cultures, we are checking with [place] ED to see if they did. We also obtained a set of cultures though  they are well after antibiotics.  - IV Vancomycin & Cefepime, will consult with ID about ideal long term regimen. - Will need PICC once blood cultures are clear x 48 hours  Severe Opioid Use Disorder: Moderately high tolerance and dependence on opioids. She is having mild withdrawal right now. Acute pain is only mild, manageable. It is essential that we treat her withdrawal symptoms or she is high risk to forego appropriate antibiotic treatment.  - Start Suboxone 4mg  now. Will re-evaluate in a few hours and probably give another 4mg  dose later today.  - Increase dose to 8mg  BID starting tomorrow morning.  - Check HCV antibody  Normocytic Anemia: Hgb 9.5, down from 11.5 seven months ago.  -- Repeat AM CBC -- F/u ferritin   FEN: No fluids, replete lytes prn, regular diet VTE ppx: Lovenox  Code Status: FULL  Dispo: Admit patient to Inpatient with expected length of stay greater than 2 midnights.  SignedReymundo Poll, MD 05/22/2018, 10:45 AM  Pager: [phone]    Internal Medicine Attending:   I saw and examined the patient. I reviewed the resident's note and I agree with the resident's findings and plan as documented in the resident's note. I edited the note for accuracy.

## 2018-05-22 NOTE — ED Notes (Signed)
Pt up and walking around removing all monitoring equipment; pt encouraged to allow equipment to remain in place so that vital signs can be obtained.

## 2018-05-22 NOTE — ED Notes (Signed)
EDP [facility]. Read Drivers[facility]olpus [facility] to discuss results, meds and transfer plan for MRI. Verbalizes understanding. Agreeable.

## 2018-05-22 NOTE — Telephone Encounter (Signed)
Post ED Visit - Positive Culture Follow-up  Culture report reviewed by antimicrobial stewardship pharmacist:  []  Enzo BiNathan Batchelder, Pharm.D. []  Celedonio MiyamotoJeremy Frens, [address]Ph[address]D., BCPS AQ-ID []  Garvin FilaMike Maccia, Pharm.D., BCPS []  Georgina PillionElizabeth Martin, Pharm.D., BCPS []  [place]h Pham, [place] BCPS, AAHIVP []  Estella HuskMichelle Turner, Pharm.D., BCPS, AAHIVP []  Lysle Pearlachel Rumbarger, PharmD, BCPS []  Sherlynn CarbonAustin Lucas, PharmD []  Pollyann SamplesAndy Johnston, PharmD, BCPS Cephus RicherBrooke Badgett PharmD  Positive urine culture Treated with none,  no further patient follow-up is required at this time, patient is currently in the ED department  Charlotte MullMiller, Charlotte Mitchell 05/22/2018, 1:00 PM

## 2018-05-22 NOTE — Progress Notes (Signed)
We were unable to get pt  To answer the MRI screening forms. When attempted to ask questions pt goes to sleep and or mumbles. Megan in MRI informed of above.

## 2018-05-22 NOTE — ED Notes (Signed)
Up to b/r, steady gait with IV pole.

## 2018-05-22 NOTE — ED Notes (Signed)
Returns from b/r, no changes, restless and moaning, returned to stretcher, SR up x2.

## 2018-05-22 NOTE — ED Triage Notes (Signed)
PT transported to short stay  For sedation MRI.

## 2018-05-22 NOTE — ED Notes (Signed)
Pt went to MRI.

## 2018-05-22 NOTE — ED Notes (Signed)
Report given to Foye ClockKristina, RN at [facility].

## 2018-05-22 NOTE — Anesthesia Preprocedure Evaluation (Signed)
Anesthesia Evaluation  Patient identified by MRN, date of birth, ID band Patient awake    Reviewed: Allergy & Precautions, NPO status , Patient's Chart, lab work & pertinent test results  Airway Mallampati: II  TM Distance: >3 FB Neck ROM: Full    Dental no notable dental hx.    Pulmonary neg pulmonary ROS, Current Smoker,    Pulmonary exam normal breath sounds clear to auscultation       Cardiovascular negative cardio ROS Normal cardiovascular exam Rhythm:Regular Rate:Normal     Neuro/Psych negative neurological ROS  negative psych ROS   GI/Hepatic negative GI ROS, (+)     substance abuse  cocaine use and IV drug use,   Endo/Other  negative endocrine ROS  Renal/GU negative Renal ROS  negative genitourinary   Musculoskeletal negative musculoskeletal ROS (+)   Abdominal   Peds negative pediatric ROS (+)  Hematology negative hematology ROS (+)   Anesthesia Other Findings   Reproductive/Obstetrics negative OB ROS                             Anesthesia Physical Anesthesia Plan  ASA: III  Anesthesia Plan: General   Post-op Pain Management:    Induction: Intravenous  PONV Risk Score and Plan: 1 and Ondansetron and Treatment may vary due to age or medical condition  Airway Management Planned: LMA  Additional Equipment:   Intra-op Plan:   Post-operative Plan: Extubation in OR  Informed Consent: I have reviewed the patients History and Physical, chart, labs and discussed the procedure including the risks, benefits and alternatives for the proposed anesthesia with the patient or authorized representative who has indicated his/her understanding and acceptance.   Dental advisory given  Plan Discussed with: CRNA  Anesthesia Plan Comments:         Anesthesia Quick Evaluation

## 2018-05-22 NOTE — ED Provider Notes (Signed)
Patient sent to ER from [facility] emergency department for MRI.  Patient has a history of IV drug abuse.  She was evaluated for abdominal pain by CT scan earlier today.  CT scan raises concern for her sacral discitis and presacral abscess formation.  Upon arrival to the ER patient reports that she does have some pain in her lower abdomen and her back but it is controlled.  She has been noticing this pain for about a week.  Pain does not radiate to the legs.  She does report lower back pain with movement of the legs but no numbness, tingling or weakness.  No saddle anesthesia.  Patient will undergo MRI.   Charlotte Mitchell, Joas Motton J, MD 05/22/18 [phone]

## 2018-05-22 NOTE — Progress Notes (Signed)
MRI attempted x2 --pt given meds both times.  Pt is moving constantly and we are unable to obtain any diagnostic imaging.

## 2018-05-22 NOTE — ED Notes (Signed)
ED Provider at bedside. 

## 2018-05-22 NOTE — ED Notes (Signed)
Pt states she was seen here Friday around noon, but left before she got her results and CT. C/O abd pain radiating to both sides and back. Denies urinary s/s or vaginal discharge. "Hurts to poop."

## 2018-05-22 NOTE — ED Notes (Signed)
Up to b/r, steady gait with IV pole.  

## 2018-05-23 ENCOUNTER — Other Ambulatory Visit (HOSPITAL_COMMUNITY): Payer: Self-pay

## 2018-05-23 ENCOUNTER — Inpatient Hospital Stay (HOSPITAL_COMMUNITY): Payer: Self-pay

## 2018-05-23 ENCOUNTER — Encounter (HOSPITAL_COMMUNITY): Payer: Self-pay | Admitting: Radiology

## 2018-05-23 DIAGNOSIS — M4648 Discitis, unspecified, sacral and sacrococcygeal region: Secondary | ICD-10-CM

## 2018-05-23 DIAGNOSIS — F419 Anxiety disorder, unspecified: Secondary | ICD-10-CM

## 2018-05-23 DIAGNOSIS — R011 Cardiac murmur, unspecified: Secondary | ICD-10-CM

## 2018-05-23 DIAGNOSIS — F17210 Nicotine dependence, cigarettes, uncomplicated: Secondary | ICD-10-CM

## 2018-05-23 DIAGNOSIS — B951 Streptococcus, group B, as the cause of diseases classified elsewhere: Secondary | ICD-10-CM

## 2018-05-23 DIAGNOSIS — B9620 Unspecified Escherichia coli [E. coli] as the cause of diseases classified elsewhere: Secondary | ICD-10-CM

## 2018-05-23 DIAGNOSIS — Z888 Allergy status to other drugs, medicaments and biological substances status: Secondary | ICD-10-CM

## 2018-05-23 DIAGNOSIS — M609 Myositis, unspecified: Secondary | ICD-10-CM

## 2018-05-23 DIAGNOSIS — Z885 Allergy status to narcotic agent status: Secondary | ICD-10-CM

## 2018-05-23 DIAGNOSIS — F1120 Opioid dependence, uncomplicated: Secondary | ICD-10-CM

## 2018-05-23 DIAGNOSIS — M4626 Osteomyelitis of vertebra, lumbar region: Secondary | ICD-10-CM

## 2018-05-23 LAB — BASIC METABOLIC PANEL
ANION GAP: 10 (ref 5–15)
BUN: 7 mg/dL (ref 6–20)
CO2: 23 mmol/L (ref 22–32)
Calcium: 8.9 mg/dL (ref 8.9–10.3)
Chloride: 107 mmol/L (ref 101–111)
Creatinine, Ser: 0.66 mg/dL (ref 0.44–1.00)
Glucose, Bld: 97 mg/dL (ref 65–99)
POTASSIUM: 3.9 mmol/L (ref 3.5–5.1)
SODIUM: 140 mmol/L (ref 135–145)

## 2018-05-23 LAB — CBC
HEMATOCRIT: 31 % — AB (ref 36.0–46.0)
HEMOGLOBIN: 9.8 g/dL — AB (ref 12.0–15.0)
MCH: 29.3 pg (ref 26.0–34.0)
MCHC: 31.6 g/dL (ref 30.0–36.0)
MCV: 92.8 fL (ref 78.0–100.0)
Platelets: 293 10*3/uL (ref 150–400)
RBC: 3.34 MIL/uL — AB (ref 3.87–5.11)
RDW: 13.4 % (ref 11.5–15.5)
WBC: 7.6 10*3/uL (ref 4.0–10.5)

## 2018-05-23 LAB — ECHOCARDIOGRAM COMPLETE
HEIGHTINCHES: 64 in
Weight: 2014.12 oz

## 2018-05-23 LAB — CK: Total CK: 216 U/L (ref 38–234)

## 2018-05-23 MED ORDER — BUPRENORPHINE HCL-NALOXONE HCL 8-2 MG SL SUBL
1.0000 | SUBLINGUAL_TABLET | Freq: Two times a day (BID) | SUBLINGUAL | Status: DC
  Administered 2018-05-24 – 2018-05-25 (×3): 1 via SUBLINGUAL
  Filled 2018-05-23 (×3): qty 1

## 2018-05-23 MED ORDER — BUPRENORPHINE HCL-NALOXONE HCL 2-0.5 MG SL SUBL
2.0000 | SUBLINGUAL_TABLET | Freq: Four times a day (QID) | SUBLINGUAL | Status: DC | PRN
  Administered 2018-05-23: 2 via SUBLINGUAL
  Filled 2018-05-23: qty 2

## 2018-05-23 MED ORDER — RAMELTEON 8 MG PO TABS
8.0000 mg | ORAL_TABLET | Freq: Every day | ORAL | Status: DC
  Administered 2018-05-23 – 2018-05-24 (×2): 8 mg via ORAL
  Filled 2018-05-23 (×3): qty 1

## 2018-05-23 MED ORDER — ENSURE ENLIVE PO LIQD
237.0000 mL | Freq: Two times a day (BID) | ORAL | Status: DC
  Administered 2018-05-24 – 2018-05-25 (×2): 237 mL via ORAL

## 2018-05-23 MED ORDER — BUPRENORPHINE HCL-NALOXONE HCL 2-0.5 MG SL SUBL
2.0000 | SUBLINGUAL_TABLET | Freq: Two times a day (BID) | SUBLINGUAL | Status: DC | PRN
  Administered 2018-05-23: 2 via SUBLINGUAL
  Filled 2018-05-23: qty 2

## 2018-05-23 MED ORDER — CEFTRIAXONE SODIUM 2 G IJ SOLR
2.0000 g | INTRAMUSCULAR | Status: DC
  Administered 2018-05-23 – 2018-05-24 (×2): 2 g via INTRAVENOUS
  Filled 2018-05-23 (×3): qty 20

## 2018-05-23 NOTE — Progress Notes (Addendum)
   Subjective: Pt reports continued improvement in her pain, she does endorse feeling early signs of withdrawal.  Denies numbness or weakness of the legs this am.  Objective:  Vital signs in last 24 hours: Vitals:   05/22/18 1620 05/22/18 1835 05/22/18 2251 05/23/18 0500  BP: 112/79 112/74 103/67 118/71  Pulse: 80 78 79 (!) 51  Resp: 20 16 18 20   Temp: 99.1 F (37.3 C) 99 F (37.2 C) 98.4 F (36.9 C) 98.5 F (36.9 C)  TempSrc: Oral Oral Oral Oral  SpO2: 98% 100% 100% 100%  Weight: 125 lb 14.1 oz (57.1 kg)     Height: 5\' 4"  (1.626 m)      Physical Exam  Constitutional: No distress.  Cardiovascular: Normal rate and regular rhythm. Exam reveals no gallop and no friction rub.  Murmur heard.  Systolic murmur is present with a grade of 1/6. Pulmonary/Chest: Effort normal and breath sounds normal. No respiratory distress. She has no wheezes. She has no rales. She exhibits no tenderness.  Abdominal: Soft. Bowel sounds are normal. She exhibits no distension and no mass. There is no tenderness. There is no rebound and no guarding.  Neurological: She is alert.  Preserved sensation and strength in lower extremities bilaterally.   Skin: She is not diaphoretic.    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  Treating empirically with broad spectrum antibiotics.  -neurosurgery consult is that there is no indication for surgery at this time -ID consulted appreciate recommendations -consulting IR will hope that abscess is actionable and may be both diagnostic and therapeutic -started on vanc/zosyn will adjust empiric coverage per ID's recommendations   Severe Opioid Use Disorder: Daily use of heroin occasional crack cocaine use. Has been injecting < 1  year but using percocet since 15. Has tried suboxone several years ago found it helpful. Complicated by homelessness. Her father was present this morning, she did give us permission to update him on medical issues.  -Pt with minimal pain, started on suboxone today - Goal for 8mg  of Bupe over the course of today in divided dose.  - Increase to 8mg  BID starting tomorrow morning. - Will check HCV antibody   Normocytic Anemia: Hgb 9.8, down from 11.5 seven months ago   - continue to monitor  Systolic Murmur: given heroin use will need further evaluation  -ECHO pending   Angelita InglesWinfrey, William B, MD 05/23/2018, 8:13 AM Thornell MuleBrandon Winfrey MD PGY-1 Internal Medicine Pager # [phone]   Internal Medicine Attending:   I saw and examined the patient. I reviewed the resident's note and I agree with the resident's findings and plan as documented in the resident's note. I edited the note for accuracy.   Erlinda Honguncan Torren Maffeo MD

## 2018-05-23 NOTE — Anesthesia Postprocedure Evaluation (Signed)
Anesthesia Post Note  Patient: Charlotte Mitchell  Procedure(s) Performed: MRI WITH ANESTHESIA (N/A )     Patient location during evaluation: PACU Anesthesia Type: General Level of consciousness: awake and alert Pain management: pain level controlled Vital Signs Assessment: post-procedure vital signs reviewed and stable Respiratory status: spontaneous breathing, nonlabored ventilation, respiratory function stable and patient connected to nasal cannula oxygen Cardiovascular status: blood pressure returned to baseline and stable Postop Assessment: no apparent nausea or vomiting Anesthetic complications: no    Last Vitals:  Vitals:   05/22/18 2251 05/23/18 0500  BP: 103/67 118/71  Pulse: 79 (!) 51  Resp: 18 20  Temp: 36.9 C 36.9 C  SpO2: 100% 100%    Last Pain:  Vitals:   05/23/18 0601  TempSrc:   PainSc: Asleep                 Sem Mccaughey S

## 2018-05-23 NOTE — Consult Note (Signed)
[facility] for Infectious Disease    Date of Admission:  05/22/2018   Total days of antibiotics 3        Day 3 Vancomycin         Day 3 Zosyn               Reason for Consult: vertebral discitis/osteomyelitis     Referring Provider: Dr. Evette Doffing    Assessment: 27 y.o. female admitted for acute osteomyelitis, discitis and adjacent L3-4 / S1-3 abscesses. Infectious condition due to injection drug use - HIV Ab negative, HCV Ab pending. Blood cultures were just drawn today after receiving 2 days broad spectrum antibiotics. Urine (+) Ecoli and GBS. Considering MRI findings of presacral abscess measuring 5 x 4 x 1cm that may be difficult to drain but would recommend discussion with IR to determine possibility both for therapeutic benefit and culture data. I discussed this with Shanitra today - she became withdrawn and upset and requested that I come back another time as she was "done talking about this today." I was not able to get a full exam in for her.  Would for now keep vancomycin and change zosyn to ceftriaxone.   Greatly appreciate medicine team managing her withdrawal symptoms. She ultimately is not certain she is ready to completely quit drugs. Will at another time discuss further harm reduction clinic to help with clean needle use. May also benefit from PrEP to lower risk for HIV acquisition if she shares needles/items related to injection.    Plan: 1. Continue vancomycin  2. Change piperacillin-tazobactam to ceftriaxone 3. CRP/ESR for baseline 4. Await culture results and IR evaluation   Principal Problem:   Discitis of sacral region Active Problems:   Myositis iliopsoas muscle    Opioid use disorder, severe, dependence ([place])   . enoxaparin (LOVENOX) injection  40 mg Subcutaneous Q24H    HPI: Charlotte Mitchell is a 27 y.o. female with past medical history significant for anxiety, injection drug use presented to [facility] ED with 4 day history of generalized  abdominal pain, nausea and vomiting, and lower back pain. She injects heroin and cocaine 3-4 times a day for one year. CD of the abdomen/pelvis demonstrated S1-2 discitis/osteomyelitis with anterior paraspinal soft tissue abscess. Transferred to [facility] ED for MRI of the lumbar sacral spine however she was unable to sit through the exam without anesthesia assistance.   She is not quite sure what all is going on presently and would like me to explain a little more for her. She denies any fevers/chills or night sweats prior to admission. Only symptoms were nausea, abdominal pain, low back pain and one episode of vomiting. She is feeling better with suboxone started to manage her withdrawal symptoms. She has never had any infections like this in the past.   Review of Systems: Review of Systems  Constitutional: Negative for chills, diaphoresis, fever and malaise/fatigue.  Eyes: Negative for blurred vision.  Respiratory: Negative for cough and shortness of breath.   Cardiovascular: Negative for chest pain, orthopnea and leg swelling.  Gastrointestinal: Positive for abdominal pain. Negative for diarrhea.  Genitourinary: Positive for dysuria.  Musculoskeletal: Positive for back pain.  Skin: Negative for rash.  Neurological: Negative for sensory change, speech change and focal weakness.  Psychiatric/Behavioral: Positive for substance abuse.    Past Medical History:  Diagnosis Date  . Anxiety   . No pertinent past medical history   . Substance abuse ([place])  Social History   Tobacco Use  . Smoking status: Current Every Day Smoker    Packs/day: 0.00    Years: 9.00    Pack years: 0.00    Types: Cigarettes  . Smokeless tobacco: Never Used  Substance Use Topics  . Alcohol use: No  . Drug use: Yes    History reviewed. No pertinent family history. Allergies  Allergen Reactions  . Hydrocodone Itching  . Zofran [Ondansetron Hcl] Other (See Comments)    "Blacked out for 6 hours"     OBJECTIVE: Blood pressure 109/78, pulse 71, temperature 98.4 F (36.9 C), temperature source Oral, resp. rate 18, height _0  (1.626 m), weight 125 lb 14.1 oz (57.1 kg), SpO2 100 %, unknown if currently breastfeeding.  Physical Exam  Constitutional: She is oriented to person, place, and time.  Resting quietly in bed. Wrapped under the covers. Not willing to have full exam right now.   HENT:  Mouth/Throat: No oral lesions. No dental abscesses.  Cardiovascular: Normal rate, regular rhythm and normal heart sounds.  Pulmonary/Chest: Effort normal.  Abdominal: Soft.  Musculoskeletal:       Lumbar back: She exhibits tenderness.  Neurological: She is alert and oriented to person, place, and time.  Psychiatric: She has a normal mood and affect. Her behavior is normal. Judgment normal.  Nursing note and vitals reviewed.   Lab Results Lab Results  Component Value Date   WBC 7.6 05/23/2018   HGB 9.8 (L) 05/23/2018   HCT 31.0 (L) 05/23/2018   MCV 92.8 05/23/2018   PLT 293 05/23/2018    Lab Results  Component Value Date   CREATININE 0.66 05/23/2018   BUN 7 05/23/2018   NA 140 05/23/2018   K 3.9 05/23/2018   CL 107 05/23/2018   CO2 23 05/23/2018    Lab Results  Component Value Date   ALT 9 (L) 05/19/2018   AST 14 (L) 05/19/2018   ALKPHOS 72 05/19/2018   BILITOT 0.3 05/19/2018    No results found for: ESRSEDRATE, CRP  MRI Lumbosacral Spine 05/22/18 >> IMPRESSION SACRUM:  1. Osteomyelitis at S1, S2, and S3 with adjacent cellulitis in the soft tissues. 2. Small complex multi loculated presacral abscess measuring approximately 5 x 4 x 1 cm, associated with the discitis at S1-2. 3. Severe myositis of the left iliopsoas muscle. 4. Abnormal enhancement of the epidural soft tissues at S1-2 consistent with cellulitis but without epidural abscess.  Microbiology: Urine Culture 6/14 >> E coli (>100k), GBS (50k) BCx 6/18 >> pending   Janene Madeira, MSN, NP-C [place] [place] Cell: [phone] Pager: [phone]  05/23/2018 2:26 PM

## 2018-05-23 NOTE — Progress Notes (Signed)
  Echocardiogram 2D Echocardiogram has been performed.  Lesleigh Hughson G Gray Maugeri 05/23/2018, 10:07 AM

## 2018-05-24 LAB — BASIC METABOLIC PANEL
ANION GAP: 9 (ref 5–15)
BUN: 9 mg/dL (ref 6–20)
CALCIUM: 8.9 mg/dL (ref 8.9–10.3)
CO2: 21 mmol/L — AB (ref 22–32)
Chloride: 112 mmol/L — ABNORMAL HIGH (ref 101–111)
Creatinine, Ser: 0.68 mg/dL (ref 0.44–1.00)
GFR calc non Af Amer: >60 mL/min (ref >60)
GLUCOSE: 104 mg/dL — AB (ref 65–99)
POTASSIUM: 3.5 mmol/L (ref 3.5–5.1)
Sodium: 142 mmol/L (ref 135–145)

## 2018-05-24 LAB — PHOSPHORUS: PHOSPHORUS: 3.9 mg/dL (ref 2.5–4.6)

## 2018-05-24 LAB — CBC
HEMATOCRIT: 32.5 % — AB (ref 36.0–46.0)
HEMOGLOBIN: 10.1 g/dL — AB (ref 12.0–15.0)
MCH: 29 pg (ref 26.0–34.0)
MCHC: 31.1 g/dL (ref 30.0–36.0)
MCV: 93.4 fL (ref 78.0–100.0)
Platelets: 295 10*3/uL (ref 150–400)
RBC: 3.48 MIL/uL — ABNORMAL LOW (ref 3.87–5.11)
RDW: 14.1 % (ref 11.5–15.5)
WBC: 10.8 10*3/uL — AB (ref 4.0–10.5)

## 2018-05-24 LAB — SEDIMENTATION RATE: Sed Rate: 101 mm/hr — ABNORMAL HIGH (ref 0–22)

## 2018-05-24 LAB — MAGNESIUM: MAGNESIUM: 1.9 mg/dL (ref 1.7–2.4)

## 2018-05-24 LAB — C-REACTIVE PROTEIN: CRP: 3.2 mg/dL — AB (ref <1.0)

## 2018-05-24 LAB — VANCOMYCIN, TROUGH: Vancomycin Tr: 9 ug/mL — ABNORMAL LOW (ref 15–20)

## 2018-05-24 MED ORDER — KETOROLAC TROMETHAMINE 15 MG/ML IJ SOLN
15.0000 mg | Freq: Once | INTRAMUSCULAR | Status: AC
  Administered 2018-05-24: 15 mg via INTRAVENOUS
  Filled 2018-05-24: qty 1

## 2018-05-24 MED ORDER — VANCOMYCIN HCL IN DEXTROSE 1-5 GM/200ML-% IV SOLN
1000.0000 mg | Freq: Three times a day (TID) | INTRAVENOUS | Status: DC
  Administered 2018-05-25 (×2): 1000 mg via INTRAVENOUS
  Filled 2018-05-24 (×3): qty 200

## 2018-05-24 MED ORDER — POLYETHYLENE GLYCOL 3350 17 G PO PACK
17.0000 g | PACK | Freq: Every day | ORAL | Status: DC
  Filled 2018-05-24 (×2): qty 1

## 2018-05-24 MED ORDER — ADULT MULTIVITAMIN W/MINERALS CH
1.0000 | ORAL_TABLET | Freq: Every day | ORAL | Status: DC
  Administered 2018-05-24 – 2018-05-25 (×2): 1 via ORAL
  Filled 2018-05-24 (×2): qty 1

## 2018-05-24 NOTE — Plan of Care (Signed)

## 2018-05-24 NOTE — Progress Notes (Signed)
Initial Nutrition Assessment  DOCUMENTATION CODES:   Not applicable  INTERVENTION:   -MVI with minerals daily -Continue Ensure Enlive po BID, each supplement provides 350 kcal and 20 grams of protein  NUTRITION DIAGNOSIS:   Increased nutrient needs related to acute illness as evidenced by estimated needs.  GOAL:   Patient will meet greater than or equal to 90% of their needs  MONITOR:   PO intake, Supplement acceptance, Labs, Weight trends, Skin, I & O's  REASON FOR ASSESSMENT:   Malnutrition Screening Tool    ASSESSMENT:   Patient is a 27 year old woman who came to the [facility] ED with 4 days of generalized abdominal pain, nausea, one episode of emesis, and lower back pain  Pt admitted with acute osteomyelitis/discitis of lumbar and sacral bodies.  Spoke with pt, who had flat affect at time of visit. She shared that she consumed a good breakfast this morning. Observed Ensure supplements at bedside, which pt reports she liked and drank all of.   Pt reports she had a very poor appetite PTA, however, did not elaborate further when probed. Pt closed her eyes multiple times during interview and was difficult for pt to answer anything further than close ended questions. Per H&P. pt is homeless and struggles with food insecurity. Meal completion 25-100%.   No recent wt hx available, however, noted pt with hx of distant wt loss.   Toxicology screen positive for opiates, cocaine, and benzodiazepines.   Pt does not meet criteria for malnutrition at this time, however, is at risk given history of IVDU.   Labs reviewed.   NUTRITION - FOCUSED PHYSICAL EXAM:    Most Recent Value  Orbital Region  No depletion  Upper Arm Region  No depletion  Thoracic and Lumbar Region  No depletion  Buccal Region  Mild depletion  Temple Region  Mild depletion  Clavicle Bone Region  No depletion  Clavicle and Acromion Bone Region  No depletion  Scapular Bone Region  No depletion  Dorsal  Hand  No depletion  Patellar Region  No depletion  Anterior Thigh Region  No depletion  Posterior Calf Region  No depletion  Edema (RD Assessment)  None  Hair  Reviewed  Eyes  Reviewed  Mouth  Reviewed  Skin  Reviewed  Nails  Reviewed       Diet Order:   Diet Order           Diet regular Room service appropriate? Yes; Fluid consistency: Thin  Diet effective now          EDUCATION NEEDS:   Not appropriate for education at this time  Skin:  Skin Assessment: Reviewed RN Assessment  Last BM:  PTA  Height:   Ht Readings from Last 1 Encounters:  05/22/18 5\' 4"  (1.626 m)    Weight:   Wt Readings from Last 1 Encounters:  05/22/18 125 lb 14.1 oz (57.1 kg)    Ideal Body Weight:  54.5 kg  BMI:  Body mass index is 21.61 kg/m.  Estimated Nutritional Needs:   Kcal:  1650-1850  Protein:  80-95 grams  Fluid:  1.6-1.8 L    Rylyn Ranganathan A. Mayford KnifeWilliams, RD, LDN, CDE Pager: [phone] After hours Pager: [phone]

## 2018-05-24 NOTE — Progress Notes (Signed)
   Subjective: Pt reports no pain this morning, feels some mild withdrawal symptoms but just received her morning suboxone dose before our arrival.    Objective:  Vital signs in last 24 hours: Vitals:   05/23/18 0500 05/23/18 1323 05/23/18 2139 05/24/18 0457  BP: 118/71 109/78 112/75 103/72  Pulse: (!) 51 71 66 72  Resp: 20 18 16 16   Temp: 98.5 F (36.9 C) 98.4 F (36.9 C) 98.3 F (36.8 C) 98.8 F (37.1 C)  TempSrc: Oral Oral Oral Oral  SpO2: 100% 100% 100% 100%  Weight:      Height:       Physical Exam  Constitutional: No distress.  Cardiovascular: Normal rate and regular rhythm. Exam reveals no gallop and no friction rub.  Murmur heard.  Systolic murmur is present with a grade of 1/6. Pulmonary/Chest: Effort normal and breath sounds normal. No respiratory distress. She has no wheezes. She has no rales. She exhibits no tenderness.  Abdominal: Soft. Bowel sounds are normal. She exhibits no distension and no mass. There is no tenderness. There is no rebound and no guarding.  Neurological: She is alert.  Skin: She is not diaphoretic.    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  Treating empirically with broad spectrum antibiotics. Neurosurgery consulted felt that there is no indication for surgery at this time.  -ID consulted appreciate recommendations -IR consulted, unfortunately due to location they do not feel either abscess would be safely actionable -continue vancomycin ceftriaxone -confirmed that no blood cultures were ordered at [facility] -our blood culture in process drawn 6/18  Severe Opioid Use Disorder: Daily use of heroin occasional crack cocaine use. Has been injecting < 1 year but  using percocet since 15. Has tried suboxone several years ago found it helpful. Complicated by homelessness. Her father was present this morning, she did give us permission to update him on medical issues.  -Pt with minimal pain, started on suboxone 6/18 -begin 8-2 of suboxone BID   -HCV testing   Normocytic Anemia:  down from 11.5 seven months ago   -continue to monitor  Systolic Murmur: evaluated further given heroin use.  -ECHO results returned with no visible vegetations, trivial regurgitation of mitral valve, poor visualization of aortic valve but no regurgitation or stenosis evident   Angelita InglesWinfrey, Caliph Borowiak B, MD 05/24/2018, 10:12 AM Thornell MuleBrandon Daniele Yankowski MD PGY-1 Internal Medicine Pager # [phone]

## 2018-05-24 NOTE — Progress Notes (Signed)
Pharmacy Antibiotic Note  Charlotte Mitchell is a 27 y.o. female transferred from [facility] ED on 05/22/2018 with discitis/pre-sacral abscess.  Pharmacy has been consulted for vancomycin dosing.  Patient is also on Rocephin.  Renal function is stable and vancomycin trough is sub-therapeutic.  Afebrile, WBC mildly elevated at 10.8   Plan: Increase vanc to 1gm IV Q8H for goal trough 15-20 mcg/mL. Monitor renal fxn, clinical progress, vanc trough prior to 4th dose of new regimen   Height: 5\' 4"  (162.6 cm) Weight: 125 lb 14.1 oz (57.1 kg) IBW/kg (Calculated) : 54.7  Temp (24hrs), Avg:98.5 F (36.9 C), Min:98.3 F (36.8 C), Max:98.8 F (37.1 C)  Recent Labs  Lab 05/19/18 1201 05/22/18 0230 05/23/18 0651 05/24/18 0550 05/24/18 1047  WBC 5.9 7.3 7.6 10.8*  --   CREATININE 0.61 0.63 0.66 0.68  --   VANCOTROUGH  --   --   --   --  9*    Estimated Creatinine Clearance: 89.6 mL/min (by C-G formula based on SCr of 0.68 mg/dL).    Allergies  Allergen Reactions  . Hydrocodone Itching  . Zofran [Ondansetron Hcl] Other (See Comments)    "Blacked out for 6 hours"     Vanc 6/17 >> Zosyn 6/17 >> 6/18 CTX 6/18 >>  6/19 VT = 9 mcg/mL on 500mg  q8 (drawn 1 hr early) >> 1000mg  q8  6/18 BCx -    Ezrie Bunyan D. Laney Potashang, PharmD, BCPS, BCCCP Pager:  [phone] 05/24/2018, 12:30 PM

## 2018-05-24 NOTE — Progress Notes (Signed)
ID PROGRESS NOTE  Patient reports having chills/feverish, but much improved today. Pain also under better control.  - continue with current plan to treat for 6 wk IV therapy for discitis and associated presacral phlegmon. - continue on suboxone for opiate use disorder  Aram BeechamCynthia B. Drue SecondSnider MD MPH [facility] [phone]

## 2018-05-25 DIAGNOSIS — D649 Anemia, unspecified: Secondary | ICD-10-CM

## 2018-05-25 MED ORDER — GABAPENTIN 300 MG PO CAPS: 300.0000 mg | ORAL_CAPSULE | Freq: Three times a day (TID) | ORAL | Status: DC

## 2018-05-25 MED ORDER — GABAPENTIN 100 MG PO CAPS
100.0000 mg | ORAL_CAPSULE | Freq: Three times a day (TID) | ORAL | Status: DC
  Administered 2018-05-25: 100 mg via ORAL
  Filled 2018-05-25: qty 1

## 2018-05-25 MED ORDER — ORITAVANCIN DIPHOSPHATE 400 MG IV SOLR
1200.0000 mg | Freq: Once | INTRAVENOUS | Status: AC
  Administered 2018-05-25: 1200 mg via INTRAVENOUS
  Filled 2018-05-25 (×2): qty 120

## 2018-05-25 MED ORDER — GABAPENTIN 300 MG PO CAPS: 300.0000 mg | ORAL_CAPSULE | Freq: Three times a day (TID) | ORAL | 0 refills | Status: DC

## 2018-05-25 MED ORDER — CIPROFLOXACIN HCL 750 MG PO TABS: 750.0000 mg | ORAL_TABLET | Freq: Two times a day (BID) | ORAL | 0 refills | Status: DC

## 2018-05-25 MED ORDER — DOXYCYCLINE HYCLATE 100 MG PO TABS: 100.0000 mg | ORAL_TABLET | Freq: Two times a day (BID) | ORAL | 0 refills | Status: DC

## 2018-05-25 NOTE — Progress Notes (Signed)
Discharge home against medical advice. Papers signed per policy. Prescription was given to the patient and instruction about meds and follow up was instructed to patient and mother. No questions was verbalized.

## 2018-05-25 NOTE — Care Management Note (Signed)
Case Management Note  Patient Details  Name: Molli HazardSamantha J Marich MRN: [id number] Date of Birth: 1991/01/07  Subjective/Objective:                    Action/Plan:  Patient unsure if she is going to stay for IV ABX or not. Patient's mother talking to her at present.   [facility] . Entered over rides so patient can receive 2 months of Doxycycline and Cipro with no co pay. Attending will have to do prescriptions. Dr Frances FurbishWinfrey aware.   Placed MATCH letter in shadow chart in case patient decides to leave. Expected Discharge Date:                  Expected Discharge Plan:  Home/Self Care  In-House Referral:  Clinical Social Work  Discharge planning Services  CM Consult, Medication Assistance, MATCH Program  Post Acute Care Choice:  NA Choice offered to:     DME Arranged:  N/A DME Agency:  NA  HH Arranged:  NA HH Agency:  NA  Status of Service:  In process, will continue to follow  If discussed at Long Length of Stay Meetings, dates discussed:    Additional Comments:  Kingsley PlanWile, [place] Savini Marie, RN 05/25/2018, 2:33 PM

## 2018-05-25 NOTE — Progress Notes (Signed)
[facility]  Date of Admission:  05/22/2018   Total days of antibiotics 5        Day 5 Vanc        Day 5 Ceftriaxone          Patient ID: Charlotte Mitchell  Principal Problem:   Discitis of sacral region Active Problems:   Myositis iliopsoas muscle    Opioid use disorder, severe, dependence (HCC)  . buprenorphine-naloxone  1 tablet Sublingual BID  . enoxaparin (LOVENOX) injection  40 mg Subcutaneous Q24H  . feeding supplement (ENSURE ENLIVE)  237 mL Oral BID BM  . gabapentin  100 mg Oral TID  . multivitamin with minerals  1 tablet Oral Daily  . polyethylene glycol  17 g Oral Daily  . ramelteon  8 mg Oral QHS    SUBJECTIVE: Wants to go home "to be free." Feels she is too restricted here but grappling with staying to get adequate treatment.   Allergies  Allergen Reactions  . Hydrocodone Itching  . Zofran [Ondansetron Hcl] Other (See Comments)    "Blacked out for 6 hours"    OBJECTIVE: Vitals:   05/23/18 2139 05/24/18 0457 05/24/18 1500 05/24/18 2136  BP: 112/75 103/72 113/77 110/67  Pulse: 66 72 82 64  Resp: 16 16 18 17   Temp: 98.3 F (36.8 C) 98.8 F (37.1 C) (!) 97.5 F (36.4 C) 97.9 F (36.6 C)  TempSrc: Oral Oral Oral Oral  SpO2: 100% 100% 100% 100%  Weight:      Height:       Body mass index is 21.61 kg/m.  Physical Exam  Nursing note and vitals reviewed.   Lab Results Lab Results  Component Value Date   WBC 10.8 (H) 05/24/2018   HGB 10.1 (L) 05/24/2018   HCT 32.5 (L) 05/24/2018   MCV 93.4 05/24/2018   PLT 295 05/24/2018    Lab Results  Component Value Date   CREATININE 0.68 05/24/2018   BUN 9 05/24/2018   NA 142 05/24/2018   K 3.5 05/24/2018   CL 112 (H) 05/24/2018   CO2 21 (L) 05/24/2018    Lab Results  Component Value Date   ALT 9 (L) 05/19/2018   AST 14 (L) 05/19/2018   ALKPHOS 72 05/19/2018   BILITOT 0.3 05/19/2018     Microbiology: BCx 6/18 >> no growth    Assessment:  27 y.o. female  with S1-2 presacral abscess and discitis with abscess. She is still grappling with the idea of leaving but it seems like she has made her decision to leave. She asked me to give her an hour to decide. I informed her that considering some new data that is emerging with using extended-release antibiotics for treating complicated infections with staph aureus it is almost always after approx 2 weeks of IV therapy first. We unfortunately do not have the benefit of knowing whether this is staph or some other organism since she was given antibiotics well before collecting blood cultures and IR is unable to sample due to location of abscess. Would as an alternative give her 1200 mg dose of oritavancin x 1 now with instructions to take PO Doxycycline 100 mg BID with food and Ciprofloxacin 750 mg BID at home x 7 weeks. Would also give a second dose of oritavancin vs dalbavancin outpatient if able (she is uninsured) in 7-10 days.    Plan:  1. 1200 mg oritavancin IV x 1  2.  Would continue IV vancomycin / Ceftriaxone while inpatient as long as she is willing to stay.  3. If she decides to leave - please give her rx for doxycycline 100 mg BID with food and Ciprofloxacin 750 mg BID for 2 months.  4. Would agree with Narcan rx at discharge considering she is pre-contemplative regarding quitting IV drugs from our previous conversation.    Rexene Alberts, MSN, NP-C [facility] for Infectious Disease [facility] Cell: [phone] Pager: [phone]  05/25/2018  11:46 AM

## 2018-05-25 NOTE — Social Work (Addendum)
CSW aware pt is considering leaving after an infusion this afternoon.  RN aware to let CSW know when pt back in room (currently on a walk with her mom) in order to give pt information about [facility] for access to counseling, clean injection kits, and continued access to Narcan.   3:30pm- CSW spoke with pt at bedside, pt gave verbal permission for her mother to be in room while CSW discussed resources for [facility]. [facility] is able to provide pt with services indicated above. Pt acknowledged resources, states she has utilized their services before, is aware of their syringe exchange program but knows that she needs to utilize it regularly if she does continue use in order to have clean kits for injection. CSW reminded pt that she also has access to counseling services and [facility] which [facility] staff would like for pt to become engaged with. Pt encouraged to program the on call [facility] number in her phone.   CSW and pt had extended discussion about harm reduction strategies and the importance of bringing Narcan with her whenever she or another individual may be using. Harm reduction does not mean that there is no risk to using IVD, pt states understanding. CSW and pt spoke about "[place]" laws that are present in [place], meaning that if pt or another individual needs medical attention that she should call 911 without fear- safety and health is our top priority for pt.   CSW continued to encourage pt, like attending care team has, that she should remain in the hospital to continue her iv abx, however pt expressed desire to discharge and use. CSW called to [facility] and spoke with syringe Optician, dispensingexchange coordinator, he did say that services would be available until 8pm tonight if pt does choose to discharge.   Doy HutchingIsabel H Davinity Mitchell, LCSWA [facility] Clinical Social Work [phone]

## 2018-05-25 NOTE — Progress Notes (Signed)
   Subjective: Pt reports minimal pain but is very anxious to leave.  We discussed with her the importance of staying for IV antibiotic therapy and the risks associated with not receiving this therapy.  She agreed to think it over some more.  We discussed that if she felt she must leave to please let us know before doing so, so we could optimize her as best we can as she leaves.     Objective:  Vital signs in last 24 hours: Vitals:   05/23/18 2139 05/24/18 0457 05/24/18 1500 05/24/18 2136  BP: 112/75 103/72 113/77 110/67  Pulse: 66 72 82 64  Resp: 16 16 18 17   Temp: 98.3 F (36.8 C) 98.8 F (37.1 C) (!) 97.5 F (36.4 C) 97.9 F (36.6 C)  TempSrc: Oral Oral Oral Oral  SpO2: 100% 100% 100% 100%  Weight:      Height:       Physical Exam  Constitutional: She appears well-developed. She does not appear ill.  HENT:  Head: Normocephalic and atraumatic.  Eyes: No scleral icterus.  Pulmonary/Chest: Effort normal. No respiratory distress.  Neurological: She is alert.  Psychiatric: Her mood appears anxious. She exhibits a depressed mood.    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  Treating empirically with broad spectrum antibiotics. Neurosurgery consulted felt that there is no indication for surgery at this time. IR consulted, unfortunately due to location they do not feel either abscess would be safely actionable  -ID consulted appreciate recommendations -continue vancomycin ceftriaxone -confirmed that no blood cultures were ordered at [facility] our blood culture is in process no growth to date drawn 6/18 -pt will likely leave soon given our conversation this am, we will discuss with our ID colleagues about  an oral regimen if the patient were to decide to leave early before finishing IV therapy.  Severe Opioid Use Disorder: Daily use of heroin occasional crack cocaine use. Has been injecting < 1 year but using percocet since 15. Has tried suboxone several years ago found it helpful. Complicated by homelessness. Her father was present this morning, she did give us permission to update him on medical issues.  -Pt with minimal pain, started on suboxone 6/18 -continue 8-2 of suboxone BID   -HCV testing in process -withdrawal symptoms appear to be well controlled   Normocytic Anemia:  down from 11.5 seven months ago   -continue to monitor   Angelita InglesWinfrey, Taviana Westergren B, MD 05/25/2018, 7:49 AM Thornell MuleBrandon Brenly Trawick MD PGY-1 Internal Medicine Pager # [phone]

## 2018-05-26 DIAGNOSIS — M4620 Osteomyelitis of vertebra, site unspecified: Secondary | ICD-10-CM

## 2018-05-26 LAB — HCV INTERPRETATION

## 2018-05-26 LAB — HCV AB W REFLEX TO QUANT PCR: HCV Ab: <0.1 s/co ratio (ref 0.0–0.9)

## 2018-05-28 LAB — CULTURE, BLOOD (ROUTINE X 2)
CULTURE: NO GROWTH
CULTURE: NO GROWTH
SPECIAL REQUESTS: ADEQUATE
Special Requests: ADEQUATE

## 2018-05-31 ENCOUNTER — Telehealth: Payer: Self-pay | Admitting: *Deleted

## 2018-05-31 NOTE — Telephone Encounter (Signed)
Yes that is an oritavancin abx infusion.  They may be able to order the infusion if she goes to the OUD clinic visit if they can find funding.  She will need to follow up in OUD clinic for suboxone refill

## 2018-05-31 NOTE — Telephone Encounter (Signed)
Call from pt - asking if she can get her Suboxone rx filled here at the clinic,told her take it to her pharmacy. Also she asked about getting her infusion ? done here; told her we do not do infusions - unsure what she's talking about ; ? ABX. Upon reading reading d/c note; informed she will need f/u appt in our OUD clinic - [facility]

## 2018-06-05 ENCOUNTER — Ambulatory Visit: Payer: Self-pay

## 2018-06-05 ENCOUNTER — Encounter: Payer: Self-pay | Admitting: General Practice

## 2018-06-06 ENCOUNTER — Ambulatory Visit: Payer: Self-pay

## 2021-04-10 ENCOUNTER — Emergency Department (HOSPITAL_COMMUNITY): Payer: Self-pay

## 2021-04-10 ENCOUNTER — Emergency Department (HOSPITAL_COMMUNITY): Payer: Self-pay | Admitting: Anesthesiology

## 2021-04-10 ENCOUNTER — Encounter (HOSPITAL_COMMUNITY)
Admission: EM | Disposition: A | Discharge status: Home / Self Care | Payer: Self-pay | Source: Home / Self Care | Attending: Emergency Medicine

## 2021-04-10 ENCOUNTER — Ambulatory Visit (HOSPITAL_COMMUNITY)
Admission: EM | Admit: 2021-04-10 | Discharge: 2021-04-11 | Disposition: A | Discharge status: Home / Self Care | Payer: Self-pay | Attending: Emergency Medicine | Admitting: Emergency Medicine

## 2021-04-10 ENCOUNTER — Encounter (HOSPITAL_COMMUNITY): Payer: Self-pay | Admitting: Emergency Medicine

## 2021-04-10 DIAGNOSIS — Z792 Long term (current) use of antibiotics: Secondary | ICD-10-CM | POA: Insufficient documentation

## 2021-04-10 DIAGNOSIS — S63410A Traumatic rupture of collateral ligament of right index finger at metacarpophalangeal and interphalangeal joint, initial encounter: Secondary | ICD-10-CM | POA: Insufficient documentation

## 2021-04-10 DIAGNOSIS — Z23 Encounter for immunization: Secondary | ICD-10-CM | POA: Insufficient documentation

## 2021-04-10 DIAGNOSIS — S61212A Laceration without foreign body of right middle finger without damage to nail, initial encounter: Secondary | ICD-10-CM | POA: Insufficient documentation

## 2021-04-10 DIAGNOSIS — W230XXA Caught, crushed, jammed, or pinched between moving objects, initial encounter: Secondary | ICD-10-CM | POA: Insufficient documentation

## 2021-04-10 DIAGNOSIS — F17210 Nicotine dependence, cigarettes, uncomplicated: Secondary | ICD-10-CM | POA: Insufficient documentation

## 2021-04-10 DIAGNOSIS — S67190A Crushing injury of right index finger, initial encounter: Secondary | ICD-10-CM | POA: Insufficient documentation

## 2021-04-10 DIAGNOSIS — Z20822 Contact with and (suspected) exposure to covid-19: Secondary | ICD-10-CM | POA: Insufficient documentation

## 2021-04-10 DIAGNOSIS — R58 Hemorrhage, not elsewhere classified: Secondary | ICD-10-CM | POA: Diagnosis not present

## 2021-04-10 DIAGNOSIS — Z419 Encounter for procedure for purposes other than remedying health state, unspecified: Secondary | ICD-10-CM

## 2021-04-10 DIAGNOSIS — S63280A Dislocation of proximal interphalangeal joint of right index finger, initial encounter: Secondary | ICD-10-CM | POA: Insufficient documentation

## 2021-04-10 DIAGNOSIS — S61210A Laceration without foreign body of right index finger without damage to nail, initial encounter: Secondary | ICD-10-CM | POA: Insufficient documentation

## 2021-04-10 HISTORY — PX: PERCUTANEOUS PINNING: SHX2209

## 2021-04-10 HISTORY — DX: Other psychoactive substance abuse, uncomplicated: F19.10

## 2021-04-10 HISTORY — PX: I & D EXTREMITY: SHX5045

## 2021-04-10 LAB — I-STAT BETA HCG BLOOD, ED (MC, WL, AP ONLY): I-stat hCG, quantitative: <5 m[IU]/mL (ref <5)

## 2021-04-10 LAB — RESP PANEL BY RT-PCR (FLU A&B, COVID) ARPGX2
Influenza A by PCR: NEGATIVE
Influenza B by PCR: NEGATIVE
SARS Coronavirus 2 by RT PCR: NEGATIVE

## 2021-04-10 SURGERY — PINNING, EXTREMITY, PERCUTANEOUS
Anesthesia: General | Site: Finger | Laterality: Right

## 2021-04-10 MED ORDER — LIDOCAINE HCL (CARDIAC) PF 100 MG/5ML IV SOSY
PREFILLED_SYRINGE | INTRAVENOUS | Status: DC | PRN
  Administered 2021-04-10: 60 mg via INTRAVENOUS

## 2021-04-10 MED ORDER — FENTANYL CITRATE (PF) 250 MCG/5ML IJ SOLN
INTRAMUSCULAR | Status: DC | PRN
  Administered 2021-04-10: 100 ug via INTRAVENOUS

## 2021-04-10 MED ORDER — MIDAZOLAM HCL 2 MG/2ML IJ SOLN
INTRAMUSCULAR | Status: AC
  Filled 2021-04-10: qty 2

## 2021-04-10 MED ORDER — PROPOFOL 10 MG/ML IV BOLUS
INTRAVENOUS | Status: AC
  Filled 2021-04-10: qty 20

## 2021-04-10 MED ORDER — TETANUS-DIPHTH-ACELL PERTUSSIS 5-2.5-18.5 LF-MCG/0.5 IM SUSY
0.5000 mL | PREFILLED_SYRINGE | Freq: Once | INTRAMUSCULAR | Status: AC
  Administered 2021-04-10: 0.5 mL via INTRAMUSCULAR
  Filled 2021-04-10: qty 0.5

## 2021-04-10 MED ORDER — BUPIVACAINE HCL (PF) 0.5 % IJ SOLN
INTRAMUSCULAR | Status: AC
  Filled 2021-04-10: qty 30

## 2021-04-10 MED ORDER — CEFAZOLIN SODIUM 1 G IJ SOLR
INTRAMUSCULAR | Status: AC
  Filled 2021-04-10: qty 30

## 2021-04-10 MED ORDER — SUCCINYLCHOLINE 20MG/ML (10ML) SYRINGE FOR MEDFUSION PUMP - OPTIME
INTRAMUSCULAR | Status: DC | PRN
  Administered 2021-04-10: 90 mg via INTRAVENOUS

## 2021-04-10 MED ORDER — SUCCINYLCHOLINE CHLORIDE 200 MG/10ML IV SOSY
PREFILLED_SYRINGE | INTRAVENOUS | Status: AC
  Filled 2021-04-10: qty 10

## 2021-04-10 MED ORDER — 0.9 % SODIUM CHLORIDE (POUR BTL) OPTIME
TOPICAL | Status: DC | PRN
  Administered 2021-04-10: 1000 mL

## 2021-04-10 MED ORDER — BUPIVACAINE HCL (PF) 0.5 % IJ SOLN
INTRAMUSCULAR | Status: DC | PRN
  Administered 2021-04-10: 30 mL

## 2021-04-10 MED ORDER — FENTANYL CITRATE (PF) 250 MCG/5ML IJ SOLN
INTRAMUSCULAR | Status: AC
  Filled 2021-04-10: qty 5

## 2021-04-10 MED ORDER — STERILE WATER FOR IRRIGATION IR SOLN
Status: DC | PRN
  Administered 2021-04-10: 400 mL

## 2021-04-10 MED ORDER — LACTATED RINGERS IV SOLN: INTRAVENOUS | Status: DC | PRN

## 2021-04-10 MED ORDER — LIDOCAINE 2% (20 MG/ML) 5 ML SYRINGE
INTRAMUSCULAR | Status: AC
  Filled 2021-04-10: qty 5

## 2021-04-10 MED ORDER — CEPHALEXIN 500 MG PO CAPS: 500.0000 mg | ORAL_CAPSULE | Freq: Four times a day (QID) | ORAL | 0 refills | Status: AC

## 2021-04-10 MED ORDER — OXYCODONE-ACETAMINOPHEN 5-325 MG PO TABS: 1.0000 | ORAL_TABLET | Freq: Four times a day (QID) | ORAL | 0 refills | Status: DC | PRN

## 2021-04-10 MED ORDER — PROPOFOL 10 MG/ML IV BOLUS
INTRAVENOUS | Status: DC | PRN
  Administered 2021-04-10: 130 mg via INTRAVENOUS

## 2021-04-10 MED ORDER — CEFAZOLIN SODIUM-DEXTROSE 2-3 GM-%(50ML) IV SOLR
INTRAVENOUS | Status: DC | PRN
  Administered 2021-04-10: 2 g via INTRAVENOUS

## 2021-04-10 MED ORDER — DEXAMETHASONE SODIUM PHOSPHATE 10 MG/ML IJ SOLN
INTRAMUSCULAR | Status: AC
  Filled 2021-04-10: qty 1

## 2021-04-10 MED ORDER — CEFAZOLIN SODIUM-DEXTROSE 2-4 GM/100ML-% IV SOLN
2.0000 g | Freq: Once | INTRAVENOUS | Status: AC
  Administered 2021-04-10: 2 g via INTRAVENOUS
  Filled 2021-04-10: qty 100

## 2021-04-10 MED ORDER — DEXMEDETOMIDINE (PRECEDEX) IN NS 20 MCG/5ML (4 MCG/ML) IV SYRINGE
PREFILLED_SYRINGE | INTRAVENOUS | Status: DC | PRN
  Administered 2021-04-10: 4 ug via INTRAVENOUS
  Administered 2021-04-10: 8 ug via INTRAVENOUS

## 2021-04-10 MED ORDER — DEXMEDETOMIDINE (PRECEDEX) IN NS 20 MCG/5ML (4 MCG/ML) IV SYRINGE
PREFILLED_SYRINGE | INTRAVENOUS | Status: AC
  Filled 2021-04-10: qty 5

## 2021-04-10 MED ORDER — MIDAZOLAM HCL 2 MG/2ML IJ SOLN
INTRAMUSCULAR | Status: DC | PRN
  Administered 2021-04-10: 2 mg via INTRAVENOUS

## 2021-04-10 MED ORDER — DEXAMETHASONE SODIUM PHOSPHATE 10 MG/ML IJ SOLN
INTRAMUSCULAR | Status: DC | PRN
  Administered 2021-04-10: 10 mg via INTRAVENOUS

## 2021-04-10 MED ORDER — ONDANSETRON HCL 4 MG/2ML IJ SOLN
INTRAMUSCULAR | Status: AC
  Filled 2021-04-10: qty 2

## 2021-04-10 SURGICAL SUPPLY — 55 items
APL SKNCLS STERI-STRIP NONHPOA (GAUZE/BANDAGES/DRESSINGS)
BENZOIN TINCTURE PRP APPL 2/3 (GAUZE/BANDAGES/DRESSINGS) IMPLANT
BLADE CLIPPER SURG (BLADE) IMPLANT
BNDG CMPR 9X4 STRL LF SNTH (GAUZE/BANDAGES/DRESSINGS) ×1
BNDG ELASTIC 3X5.8 VLCR STR LF (GAUZE/BANDAGES/DRESSINGS) ×2 IMPLANT
BNDG ELASTIC 4X5.8 VLCR STR LF (GAUZE/BANDAGES/DRESSINGS) ×2 IMPLANT
BNDG ESMARK 4X9 LF (GAUZE/BANDAGES/DRESSINGS) ×2 IMPLANT
BNDG GAUZE ELAST 4 BULKY (GAUZE/BANDAGES/DRESSINGS) ×1 IMPLANT
CAP PIN PROTECTOR ORTHO WHT (CAP) ×1 IMPLANT
CORD BIPOLAR FORCEPS 12FT (ELECTRODE) ×1 IMPLANT
COVER SURGICAL LIGHT HANDLE (MISCELLANEOUS) ×2 IMPLANT
COVER WAND RF STERILE (DRAPES) IMPLANT
CUFF TOURN SGL QUICK 18X4 (TOURNIQUET CUFF) ×1 IMPLANT
CUFF TOURN SGL QUICK 24 (TOURNIQUET CUFF)
CUFF TRNQT CYL 24X4X16.5-23 (TOURNIQUET CUFF) IMPLANT
DRAPE OEC MINIVIEW 54X84 (DRAPES) ×1 IMPLANT
GAUZE SPONGE 4X4 12PLY STRL (GAUZE/BANDAGES/DRESSINGS) ×1 IMPLANT
GAUZE SPONGE 4X4 12PLY STRL LF (GAUZE/BANDAGES/DRESSINGS) ×1 IMPLANT
GAUZE XEROFORM 1X8 LF (GAUZE/BANDAGES/DRESSINGS) ×1 IMPLANT
GLOVE SRG 8 PF TXTR STRL LF DI (GLOVE) ×1 IMPLANT
GLOVE SURG SYN 7.5  E (GLOVE) ×2
GLOVE SURG SYN 7.5 E (GLOVE) ×1 IMPLANT
GLOVE SURG SYN 7.5 PF PI (GLOVE) ×1 IMPLANT
GLOVE SURG UNDER POLY LF SZ8 (GLOVE) ×4
GOWN STRL REIN XL XLG (GOWN DISPOSABLE) ×1 IMPLANT
GOWN STRL REUS W/ TWL LRG LVL3 (GOWN DISPOSABLE) ×2 IMPLANT
GOWN STRL REUS W/TWL LRG LVL3 (GOWN DISPOSABLE) ×2
K-WIRE .045X4 (WIRE) ×2 IMPLANT
KIT BASIN OR (CUSTOM PROCEDURE TRAY) ×2 IMPLANT
KIT TURNOVER KIT B (KITS) ×2 IMPLANT
NDL HYPO 25GX1X1/2 BEV (NEEDLE) IMPLANT
NEEDLE HYPO 25GX1X1/2 BEV (NEEDLE) ×2 IMPLANT
NS IRRIG 1000ML POUR BTL (IV SOLUTION) ×2 IMPLANT
PACK ORTHO EXTREMITY (CUSTOM PROCEDURE TRAY) ×2 IMPLANT
PAD ARMBOARD 7.5X6 YLW CONV (MISCELLANEOUS) ×3 IMPLANT
PAD CAST 3X4 CTTN HI CHSV (CAST SUPPLIES) ×1 IMPLANT
PAD CAST 4YDX4 CTTN HI CHSV (CAST SUPPLIES) ×1 IMPLANT
PADDING CAST COTTON 3X4 STRL (CAST SUPPLIES)
PADDING CAST COTTON 4X4 STRL (CAST SUPPLIES) ×2
SLING ARM FOAM STRAP LRG (SOFTGOODS) ×1 IMPLANT
SLING ARM FOAM STRAP MED (SOFTGOODS) ×1 IMPLANT
SOL PREP POV-IOD 4OZ 10% (MISCELLANEOUS) ×1 IMPLANT
SOL PREP PROV IODINE SCRUB 4OZ (MISCELLANEOUS) ×1 IMPLANT
SPLINT FIBERGLASS 3X12 (CAST SUPPLIES) ×1 IMPLANT
STRIP CLOSURE SKIN 1/2X4 (GAUZE/BANDAGES/DRESSINGS) IMPLANT
SUT ETHIBOND 3-0 V-5 (SUTURE) ×1 IMPLANT
SUT ETHILON 4 0 P 3 18 (SUTURE) IMPLANT
SUT PROLENE 4 0 PS 2 18 (SUTURE) ×1 IMPLANT
SYR CONTROL 10ML LL (SYRINGE) ×1 IMPLANT
TOWEL GREEN STERILE (TOWEL DISPOSABLE) ×1 IMPLANT
TOWEL GREEN STERILE FF (TOWEL DISPOSABLE) ×2 IMPLANT
TUBE CONNECTING 12X1/4 (SUCTIONS) ×1 IMPLANT
UNDERPAD 30X36 HEAVY ABSORB (UNDERPADS AND DIAPERS) ×2 IMPLANT
WATER STERILE IRR 1000ML POUR (IV SOLUTION) ×2 IMPLANT
YANKAUER SUCT BULB TIP NO VENT (SUCTIONS) ×1 IMPLANT

## 2021-04-10 NOTE — Consult Note (Signed)
ORTHOPAEDIC CONSULTATION  REQUESTING PHYSICIAN: Derwood Kaplan, MD  PCP:  Patient, No Pcp Per (Inactive)  Chief Complaint: Right hand injury  HPI: Charlotte Mitchell is a 30 y.o. female who complains of right hand injury.  Patient actually slammed her right hand into the car door.  She had resultant lacerations and open injuries to the index and middle finger of the right hand.  Radiographs obtained in the ER showed a open volar PIP dislocation of the index finger.  Hand surgery was consulted.  She states she had no pain in the hand prior to the injury.  She has some subjective, abnormal sensation to the tip of the index finger.  No other wounds to the extremities.  Past Medical History:  Diagnosis Date  . Anxiety   . IV drug abuse (HCC)   . 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 Substance abuse [facility])    Past Surgical History:  Procedure Laterality Date  . APPENDECTOMY    . CESAREAN SECTION  07/2008; 11/2006  . CESAREAN SECTION  02/03/2012   Procedure: CESAREAN SECTION;  Surgeon: Loney Laurence, MD;  Location: [facility] ORS;  Service: Gynecology;  Laterality: N/A;  . RADIOLOGY WITH ANESTHESIA N/A 05/22/2018   Procedure: MRI WITH ANESTHESIA;  Surgeon: Radiologist, Medication, MD;  Location: [facility] OR;  Service: Radiology;  Laterality: N/A;  . TONSILLECTOMY     Social History   Socioeconomic History  . Marital status: Married    Spouse name: Not on file  . Number of children: Not on file  . Years of education: Not on file  . Highest education level: Not on file  Occupational History  . Not on file  Tobacco Use  . Smoking status: Current Every Day Smoker    Packs/day: 1.00    Years: 16.00    Pack years: 16.00    Types: Cigarettes  . Smokeless tobacco: Never Used  Vaping Use  . Vaping Use: Former  Substance and Sexual Activity  . Alcohol use: Not Currently  . Drug use: Yes    Types: Heroin, "Crack" cocaine    Comment: 05/23/2018 "qd"  . Sexual activity: Yes   Other Topics Concern  . Not on file  Social History Narrative  . Not on file   Social Determinants of Health   Financial Resource Strain: Not on file  Food Insecurity: Not on file  Transportation Needs: Not on file  Physical Activity: Not on file  Stress: Not on file  Social Connections: Not on file   History reviewed. No pertinent family history. Allergies  Allergen Reactions  . Hydrocodone Itching  . Zofran [Ondansetron Hcl] Other (See Comments)    "Blacked out for 6 hours"   Prior to Admission medications   Medication Sig Start Date End Date Taking? Authorizing Provider  ciprofloxacin (CIPRO) 750 MG tablet Take 1 tablet (750 mg total) by mouth 2 (two) times daily. 05/25/18   Tyson Alias, MD  doxycycline (VIBRA-TABS) 100 MG tablet Take 1 tablet (100 mg total) by mouth 2 (two) times daily. 05/25/18   Tyson Alias, MD  gabapentin (NEURONTIN) 300 MG capsule Take 1 capsule (300 mg total) by mouth 3 (three) times daily. 05/25/18 05/25/19  Angelita Ingles, MD   DG Hand Complete Right  Result Date: 04/10/2021 CLINICAL DATA:  Status post trauma. EXAM: RIGHT HAND - COMPLETE 3+ VIEW COMPARISON:  None. FINDINGS: There is dislocation of the PIP joint of the second right finger. Associated soft  tissue swelling is present. No acute fracture is identified. There is no evidence of arthropathy or other focal bone abnormality. IMPRESSION: PIP joint dislocation of the second right finger. Electronically Signed   By: Aram Candela M.D.   On: 04/10/2021 19:22    Positive ROS: All other systems have been reviewed and were otherwise negative with the exception of those mentioned in the HPI and as above.  Physical Exam: General: Alert, no acute distress Cardiovascular: No edema Respiratory: No cyanosis, no use of accessory musculature Skin: Open laceration to the index finger Psychiatric: Patient is competent for consent with normal mood and affect  MUSCULOSKELETAL: Right  hand shows obvious deformity to the right index finger with a volar and ulnar laceration.  The articular condyle of the proximal phalanx is visualized within the wound.  Active range of motion of the digit was unable to be obtained due to her pain and her known PIP joint dislocation.  She does have intact sensation to both radial and ulnar aspects of the index fingertip and the fingertip is warm well perfused with brisk capillary refill.  The middle finger also shows small, partial-thickness crush injury to the fingertip.  She has mild tenderness palpation around this area and intact sensation of the middle fingertip which is also warm well perfused with brisk capillary refill.  Assessment: Right hand crush injury with open index finger PIP volar dislocation  Plan: Plan to proceed forward with irrigation and debridement of the right index finger with PIP joint reduction and pinning as well as repair of structures as indicated.  Risk, benefits and alternatives of the procedure were discussed with patient today.  These risks include but are not limited to infection, bleeding, damage to surrounding structures including blood vessels and nerves, pain, stiffness, posttraumatic arthritis and need for additional procedures.  Informed consent was obtained the patient's right hand was marked.  Plan for discharge home postoperatively and follow-up with me in 7-10 days to monitor her progress going forward.    Ernest Mallick, MD [phone]   04/10/2021 9:17 PM 1

## 2021-04-10 NOTE — Op Note (Addendum)
PREOPERATIVE DIAGNOSIS: Open right index finger PIP dislocation  POSTOPERATIVE DIAGNOSIS: Same  ATTENDING PHYSICIAN: [name]. Roney Mans, III, MD who was present and scrubbed for the entire case   ASSISTANT SURGEON: None.   ANESTHESIA: General  SURGICAL PROCEDURES: 1.  Irrigation and excisional debridement of right index finger open PIP dislocation 2.  Open reduction and pin fixation of PIP dislocation to the right index finger 3.  Repair of the ulnar collateral ligament of the PIP joint to the right index finger 4.  Neurolysis of the ulnar digital nerve to the right index finger  SURGICAL INDICATIONS: Patient is a 30 year old female who earlier today had accidental crush injury to her right hand in a car door.  She had significant deformity and open wound to the right index finger and was seen at [facility], ER.  She was found to have an open PIP joint dislocation and hand surgery was consulted.  Her articular condyle was visualized through the wound at the PIP joint of the index finger.  I did recommend proceeding forward with irrigation debridement of the finger, reduction of the PIP joint and repair of structures as indicated and she presents to the OR today for that.  FINDING: Complete dislocation of the PIP joint of the index finger which was open.  Thorough irrigation and excisional debridement was performed and the joint was reduced openly.  There is complete rupture of the ulnar collateral ligament which was repaired directly with 3-0 Ethibond suture.  Following PIP joint reduction, the PIP joint was pinned in full extension.  The ulnar digital nerve was inspected through the the full extent of the wound and was found to be intact and not in need of repair.  DESCRIPTION OF PROCEDURE: Patient was identified in the preop holding area where the risk benefits and alternatives of the procedure were discussed with the patient.  These risks include but are not limited to infection, bleeding,  damage to surrounding structures including blood vessels and nerves, pain, stiffness, posttraumatic arthritis and need for additional procedures.  Informed consent was obtained at that time the patient's right hand was marked with a surgical marking pen.  She was then brought back to the operative suite where timeout was performed identify the correct patient operative site.  She was positioned supine on the operative table with her hand outstretched on a hand table.  She was induced under general anesthesia and preoperative antibiotics were administered.  A tourniquet was placed on the upper arm.  The right upper extremity was prepped using a Betadine paint and scrub and draped in usual sterile fashion.  The limb was exsanguinated and the tourniquet was inflated.  There was a transverse laceration over the volar and ulnar aspect of the index finger PIP joint.  The articular condyle of the proximal phalanx was visualized within the wound as were the FDS and FDP tendons.  Thorough excisional debridement was performed with pickups and scissors removing injured and damaged tissue within the wound.  There was no significant gross contamination within the laceration site.  The ulnar digital nerve was closely inspected and neurolysed through the extent of the wound.  There was found to be fully intact with no need for direct repair.  Additionally the FDS and FDP tendons were closely inspected and was found to be fully intact as well without need for direct repair.  At this point the wound was copiously irrigated including the PIP joint with thorough normal saline irrigation.  The PIP joint was  then reduced under direct visualization an open type fashion.  In doing so the ulnar collateral ligament was seen and found to be completely avulsed off the articular condyle of the proximal phalanx.  There was a small amount of soft tissue still attached to the condyle so a direct repair of the ligament was performed using  interrupted 3-0 Ethibond suture.  The PIP joint was then gently actively flexed and extended under direct visualization as well as fluoroscopic images.  There was smooth concentric motion of the joint with no evidence of subluxation.  At this point though to allow for additional stability, two 0.045 K wires were placed percutaneously across the PIP joint and to remain along the radial border of both the proximal and middle phalanxes.  These cross the PIP joint in its midportion.  This provided stable fixation of the PIP joint which was confirmed with fluoroscopic images showing a concentric reduction of the PIP joint.  Wound was once again thoroughly irrigated with normal saline and the skin was closed with interrupted 4-0 Prolene sutures.  The pins were bent and cut external to the skin.  Xeroform, 4 x 4's and a well-padded volar splint immobilizing all digits in a safe position was placed.  The tourniquet was released and the patient had return of brisk capillary refill to all of her digits including the index finger.  She was awoken from her anesthesia and taken to the PACU in stable condition.  She tolerated the procedure well and there were no complications.  RADIOGRAPHIC INTERPRETATION: PA and lateral radiographs of the right index finger were obtained intraoperative under fluoroscopic images.  These show interval concentric reduction of the PIP joint with 2 crossing K wires.  Small bony fragment along the volar aspect of the PIP joint.  No other fractures or dislocations are noted.  ESTIMATED BLOOD LOSS: 10 mL  TOURNIQUET TIME: Approximately 45 minutes  SPECIMENS: None  POSTOPERATIVE PLAN: The patient will be discharged home and seen back  in the office in approximately 7 to 10 days for removal of her splint and a wound check.  I will plan on leaving her sutures in place for approximately 2 weeks and pull her pins at 3 to 4 weeks to allow for beginning range of motion to the PIP joint.  IMPLANTS:  0.045 K wires x2  Debridement type: Excisional Debridement  Side: right  Body Location: Index finger   Tools used for debridement: scissors and rongeur   Debridement depth beyond dead/damaged tissue down to healthy viable tissue: yes  Tissue layer involved: skin, subcutaneous tissue, muscle / fascia, bone  Nature of tissue removed: Devitalized Tissue  Irrigation volume: 1L     Irrigation fluid type: Normal Saline

## 2021-04-10 NOTE — Anesthesia Procedure Notes (Signed)
Procedure Name: Intubation Date/Time: 04/10/2021 10:30 PM Performed by: [name], CRNA Pre-anesthesia Checklist: Patient identified, Suction available, Emergency Drugs available and Patient being monitored Patient Re-evaluated:Patient Re-evaluated prior to induction Oxygen Delivery Method: Circle system utilized Preoxygenation: Pre-oxygenation with 100% oxygen Induction Type: IV induction, Rapid sequence and Cricoid Pressure applied Laryngoscope Size: Miller and 2 Grade View: Grade I Tube type: Oral Tube size: 7.5 mm Number of attempts: 1 Airway Equipment and Method: Stylet Placement Confirmation: ETT inserted through vocal cords under direct vision,  positive ETCO2 and breath sounds checked- equal and bilateral Secured at: 22 cm Tube secured with: Tape Dental Injury: Teeth and Oropharynx as per pre-operative assessment

## 2021-04-10 NOTE — Anesthesia Preprocedure Evaluation (Addendum)
Anesthesia Evaluation  Patient identified by MRN, date of birth, ID band Patient awake    Reviewed: Allergy & Precautions, NPO status , Patient's Chart, lab work & pertinent test results  History of Anesthesia Complications Negative for: history of anesthetic complications  Airway Mallampati: III  TM Distance: >3 FB Neck ROM: Full    Dental  (+) Poor Dentition, Dental Advisory Given, Chipped, Missing,    Pulmonary Current SmokerPatient did not abstain from smoking.,    breath sounds clear to auscultation       Cardiovascular negative cardio ROS   Rhythm:Regular     Neuro/Psych Seizures -,  PSYCHIATRIC DISORDERS Anxiety Seizures (HCC) 2016 "from methadone withdrawal"     GI/Hepatic negative GI ROS, (+)     substance abuse  IV drug use,   Endo/Other  negative endocrine ROS  Renal/GU negative Renal ROS     Musculoskeletal   Abdominal   Peds  Hematology negative hematology ROS (+)   Anesthesia Other Findings   Reproductive/Obstetrics                            Anesthesia Physical Anesthesia Plan  ASA: II and emergent  Anesthesia Plan: General   Post-op Pain Management:    Induction: Intravenous and Rapid sequence  PONV Risk Score and Plan: 2 and Ondansetron and Dexamethasone  Airway Management Planned: Oral ETT  Additional Equipment: None  Intra-op Plan:   Post-operative Plan: Extubation in OR  Informed Consent: I have reviewed the patients History and Physical, chart, labs and discussed the procedure including the risks, benefits and alternatives for the proposed anesthesia with the patient or authorized representative who has indicated his/her understanding and acceptance.     Dental advisory given  Plan Discussed with: Anesthesiologist, Surgeon and CRNA  Anesthesia Plan Comments:        Anesthesia Quick Evaluation

## 2021-04-10 NOTE — ED Notes (Signed)
Patient undressed for OR. Belongings collected and labeled, on stretcher with patient.

## 2021-04-10 NOTE — ED Triage Notes (Signed)
Pt transported from neighbors home after accidentally slamming her own hand in car door, per EMS open fx to 1st and 3rd digit on R hand, bleeding controlled. EMS unable to start IV d/t IV drug use scaring

## 2021-04-10 NOTE — Transfer of Care (Signed)
Immediate Anesthesia Transfer of Care Note  Patient: Charlotte Mitchell  Procedure(s) Performed: REDUCTION AND PINNING, INDEX FINGER (Right Finger)  Patient Location: PACU  Anesthesia Type:General  Level of Consciousness: awake and sedated  Airway & Oxygen Therapy: Patient Spontanous Breathing  Post-op Assessment: Report given to RN and Post -op Vital signs reviewed and stable  Post vital signs: Reviewed and stable  Last Vitals:  Vitals Value Taken Time  BP 111/73 04/10/21 2339  Temp    Pulse 67 04/10/21 2340  Resp 11 04/10/21 2340  SpO2 97 % 04/10/21 2340  Vitals shown include unvalidated device data.  Last Pain:  Vitals:   04/10/21 1813  TempSrc:   PainSc: 9          Complications: No complications documented.
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
## 2021-04-10 NOTE — Discharge Instructions (Signed)
Discharge Instructions  - Keep dressings in place. Do not remove them. - The dressings must stay dry - Take all medication as prescribed. Transition to over the counter pain medication as your pain improves - Keep the hand elevated over the next 48-72 hours to help with pain and swelling - Move all digits not restricted by the dressings regularly to prevent stiffness - Please call to schedule a follow up appointment with Dr. Pedro Whiters and therapy at [phone] for 7-10 days following surgery - Your pain medication have been sent digitally to your pharmacy  

## 2021-04-12 NOTE — Anesthesia Postprocedure Evaluation (Signed)
Anesthesia Post Note  Patient: CHRISMA HURLOCK  Procedure(s) Performed:  2.  Open reduction and pin fixation of PIP dislocation to the right index finger 3.  Repair of the ulnar collateral ligament of the PIP joint to the right index finger 4.  Neurolysis of the ulnar digital nerve to the right index finger (Right Finger) 1.  Irrigation and excisional debridement of right index finger open PIP dislocation (Right Finger)     Patient location during evaluation: PACU Anesthesia Type: General Level of consciousness: awake and alert Pain management: pain level controlled Vital Signs Assessment: post-procedure vital signs reviewed and stable Respiratory status: spontaneous breathing, nonlabored ventilation, respiratory function stable and patient connected to nasal cannula oxygen Cardiovascular status: blood pressure returned to baseline and stable Postop Assessment: no apparent nausea or vomiting Anesthetic complications: no   No complications documented.  Last Vitals:  Vitals:   04/11/21 0025 04/11/21 0040  BP: 126/79 124/78  Pulse: 69 (!) 57  Resp: 16 17  Temp:  36.6 C  SpO2: 100% 100%    Last Pain:  Vitals:   04/11/21 0040  TempSrc:   PainSc: 0-No pain                 Brindy Higginbotham

## 2021-04-13 ENCOUNTER — Other Ambulatory Visit: Payer: Self-pay | Admitting: Orthopaedic Surgery

## 2021-04-13 ENCOUNTER — Telehealth: Payer: Self-pay | Admitting: *Deleted

## 2021-04-13 ENCOUNTER — Encounter (HOSPITAL_COMMUNITY): Payer: Self-pay | Admitting: Orthopaedic Surgery

## 2021-04-13 NOTE — Telephone Encounter (Signed)
Pt called regarding [facility] having problems receiving e-scribed Rx and requesting her Rx be [facility] to [facility] [address]  [name] reached out to prescribing MD Roney Mans), who asked that she contact office to make request.  RNCM relayed information to pt along with Dr office number.

## 2021-09-28 ENCOUNTER — Inpatient Hospital Stay (HOSPITAL_COMMUNITY)
Admission: AD | Admit: 2021-09-28 | Discharge: 2021-09-28 | Disposition: A | Discharge status: Home / Self Care | Payer: Medicaid Other | Attending: Obstetrics and Gynecology | Admitting: Obstetrics and Gynecology

## 2021-09-28 ENCOUNTER — Encounter (HOSPITAL_COMMUNITY): Payer: Self-pay

## 2021-09-28 DIAGNOSIS — Z3A21 21 weeks gestation of pregnancy: Secondary | ICD-10-CM

## 2021-09-28 DIAGNOSIS — Z3492 Encounter for supervision of normal pregnancy, unspecified, second trimester: Secondary | ICD-10-CM

## 2021-09-28 DIAGNOSIS — O219 Vomiting of pregnancy, unspecified: Secondary | ICD-10-CM

## 2021-09-28 DIAGNOSIS — O212 Late vomiting of pregnancy: Secondary | ICD-10-CM | POA: Insufficient documentation

## 2021-09-28 LAB — URINALYSIS, ROUTINE W REFLEX MICROSCOPIC
Bilirubin Urine: NEGATIVE
Glucose, UA: NEGATIVE mg/dL
Hgb urine dipstick: NEGATIVE
Ketones, ur: 5 mg/dL — AB
Nitrite: NEGATIVE
Protein, ur: NEGATIVE mg/dL
Specific Gravity, Urine: 1.029 (ref 1.005–1.030)
pH: 5 (ref 5.0–8.0)

## 2021-09-28 LAB — POCT PREGNANCY, URINE: Preg Test, Ur: POSITIVE — AB

## 2021-09-28 NOTE — MAU Provider Note (Signed)
S Ms. Charlotte Mitchell is a 30 y.o. [phone] pregnant female at approximately [redacted]w[redacted]d who presents to MAU today with complaint of occasional nausea/vomiting (none now) and need to start prenatal care. Last period sometime in March (LMP is approximate). No other physical complaints.  Received care in last pregnancy at [facility], but does not have insurance and is well into her 2nd trimester. Has a history of heroin use, was in the MAU bathroom for but denies n/v/d.   Pertinent items noted in HPI and remainder of comprehensive ROS otherwise negative.   O BP 119/76 (BP Location: Right Arm)   Pulse 84   Temp 97.9 F (36.6 C) (Oral)   Resp 17   Ht 5\' 4"  (1.626 m)   Wt 155 lb 11.2 oz (70.6 kg)   LMP 05/03/2021 (Approximate)   SpO2 98%   BMI 26.73 kg/m  Physical Exam Vitals and nursing note reviewed.  Constitutional:      Appearance: Normal appearance.  Eyes:     Pupils: Pupils are equal, round, and reactive to light.  Cardiovascular:     Rate and Rhythm: Normal rate and regular rhythm.  Pulmonary:     Effort: Pulmonary effort is normal.  Musculoskeletal:        General: Normal range of motion.  Skin:    General: Skin is warm.     Capillary Refill: Capillary refill takes less than 2 seconds.  Neurological:     Mental Status: She is alert and oriented to person, place, and time.  Psychiatric:        Mood and Affect: Mood normal.     Comments: Appears to be in active addiction   FHR by Doppler: 145  A G4P3003 at [redacted]w[redacted]d  Fetal heart tones present  P Discharge from MAU in stable condition Follow up at [facility] to establish late [facility], message sent to office for scheduling assistance Patient may return to MAU as needed for emergent OB related complaints  [facility], CNM 09/28/2021 8:47 PM

## 2021-09-28 NOTE — MAU Note (Signed)
Pt found in lobby bathroom on 2nd name call.Charlotte Mitchell is a 30 y.o. G4P3 here in MAU reporting: Emesis 2-4 days a week. Pt is getting ready to be homeless and needs medicaid, assistance with obtaining prenatal care - states she is high risk because she has had 4 C/S. "She needs proof of pregnancy". Also has a nexplanon in her arm and states it needs to be removed LMP: 05/03/2021  Pain score: 0 There were no vitals filed for this visit.    Lab orders placed from triage:  Preg Test

## 2021-09-30 ENCOUNTER — Encounter: Payer: Medicaid Other | Admitting: Family Medicine

## 2021-10-21 ENCOUNTER — Encounter: Payer: Medicaid Other | Admitting: Family Medicine

## 2021-11-04 ENCOUNTER — Encounter: Payer: Medicaid Other | Admitting: Certified Nurse Midwife

## 2022-01-08 ENCOUNTER — Other Ambulatory Visit: Payer: Self-pay

## 2022-01-08 ENCOUNTER — Inpatient Hospital Stay (HOSPITAL_BASED_OUTPATIENT_CLINIC_OR_DEPARTMENT_OTHER): Payer: Medicaid Other

## 2022-01-08 ENCOUNTER — Encounter (HOSPITAL_COMMUNITY): Payer: Self-pay | Admitting: Obstetrics and Gynecology

## 2022-01-08 ENCOUNTER — Other Ambulatory Visit (HOSPITAL_COMMUNITY): Payer: Self-pay

## 2022-01-08 ENCOUNTER — Inpatient Hospital Stay (HOSPITAL_COMMUNITY)
Admission: AD | Admit: 2022-01-08 | Discharge: 2022-01-08 | Disposition: A | Discharge status: Home / Self Care | Payer: Medicaid Other | Attending: Obstetrics and Gynecology | Admitting: Obstetrics and Gynecology

## 2022-01-08 ENCOUNTER — Inpatient Hospital Stay: Payer: Medicaid Other

## 2022-01-08 DIAGNOSIS — Z98891 History of uterine scar from previous surgery: Secondary | ICD-10-CM | POA: Diagnosis not present

## 2022-01-08 DIAGNOSIS — O99323 Drug use complicating pregnancy, third trimester: Secondary | ICD-10-CM | POA: Diagnosis not present

## 2022-01-08 DIAGNOSIS — Z363 Encounter for antenatal screening for malformations: Secondary | ICD-10-CM | POA: Diagnosis not present

## 2022-01-08 DIAGNOSIS — O2343 Unspecified infection of urinary tract in pregnancy, third trimester: Secondary | ICD-10-CM | POA: Diagnosis not present

## 2022-01-08 DIAGNOSIS — B3731 Acute candidiasis of vulva and vagina: Secondary | ICD-10-CM

## 2022-01-08 DIAGNOSIS — O99019 Anemia complicating pregnancy, unspecified trimester: Secondary | ICD-10-CM

## 2022-01-08 DIAGNOSIS — O99013 Anemia complicating pregnancy, third trimester: Secondary | ICD-10-CM

## 2022-01-08 DIAGNOSIS — O0930 Supervision of pregnancy with insufficient antenatal care, unspecified trimester: Secondary | ICD-10-CM

## 2022-01-08 DIAGNOSIS — O0990 Supervision of high risk pregnancy, unspecified, unspecified trimester: Secondary | ICD-10-CM

## 2022-01-08 DIAGNOSIS — Z3689 Encounter for other specified antenatal screening: Secondary | ICD-10-CM | POA: Diagnosis not present

## 2022-01-08 DIAGNOSIS — Z3A35 35 weeks gestation of pregnancy: Secondary | ICD-10-CM

## 2022-01-08 DIAGNOSIS — O0933 Supervision of pregnancy with insufficient antenatal care, third trimester: Secondary | ICD-10-CM | POA: Diagnosis not present

## 2022-01-08 DIAGNOSIS — Z3A34 34 weeks gestation of pregnancy: Secondary | ICD-10-CM

## 2022-01-08 DIAGNOSIS — F1910 Other psychoactive substance abuse, uncomplicated: Secondary | ICD-10-CM

## 2022-01-08 DIAGNOSIS — O34219 Maternal care for unspecified type scar from previous cesarean delivery: Secondary | ICD-10-CM | POA: Diagnosis not present

## 2022-01-08 LAB — DIFFERENTIAL
Abs Immature Granulocytes: 0.04 10*3/uL (ref 0.00–0.07)
Basophils Absolute: 0 10*3/uL (ref 0.0–0.1)
Basophils Relative: 0 %
Eosinophils Absolute: 0.1 10*3/uL (ref 0.0–0.5)
Eosinophils Relative: 1 %
Immature Granulocytes: 0 %
Lymphocytes Relative: 21 %
Lymphs Abs: 2.4 10*3/uL (ref 0.7–4.0)
Monocytes Absolute: 0.7 10*3/uL (ref 0.1–1.0)
Monocytes Relative: 7 %
Neutro Abs: 8.2 10*3/uL — ABNORMAL HIGH (ref 1.7–7.7)
Neutrophils Relative %: 71 %

## 2022-01-08 LAB — RAPID URINE DRUG SCREEN, HOSP PERFORMED
Amphetamines: POSITIVE — AB
Barbiturates: NOT DETECTED
Benzodiazepines: POSITIVE — AB
Cocaine: NOT DETECTED
Opiates: NOT DETECTED
Tetrahydrocannabinol: NOT DETECTED

## 2022-01-08 LAB — TYPE AND SCREEN
ABO/RH(D): O POS
Antibody Screen: NEGATIVE

## 2022-01-08 LAB — URINALYSIS, MICROSCOPIC (REFLEX): WBC, UA: >50 WBC/hpf (ref 0–5)

## 2022-01-08 LAB — WET PREP, GENITAL
Clue Cells Wet Prep HPF POC: NONE SEEN
Sperm: NONE SEEN
Trich, Wet Prep: NONE SEEN
WBC, Wet Prep HPF POC: >=10 — AB (ref <10)

## 2022-01-08 LAB — URINALYSIS, ROUTINE W REFLEX MICROSCOPIC
Bilirubin Urine: NEGATIVE
Glucose, UA: NEGATIVE mg/dL
Hgb urine dipstick: NEGATIVE
Ketones, ur: NEGATIVE mg/dL
Nitrite: POSITIVE — AB
Protein, ur: NEGATIVE mg/dL
Specific Gravity, Urine: 1.02 (ref 1.005–1.030)
pH: 6 (ref 5.0–8.0)

## 2022-01-08 LAB — CBC
HCT: 31.8 % — ABNORMAL LOW (ref 36.0–46.0)
Hemoglobin: 10.7 g/dL — ABNORMAL LOW (ref 12.0–15.0)
MCH: 31.8 pg (ref 26.0–34.0)
MCHC: 33.6 g/dL (ref 30.0–36.0)
MCV: 94.4 fL (ref 80.0–100.0)
Platelets: 181 10*3/uL (ref 150–400)
RBC: 3.37 MIL/uL — ABNORMAL LOW (ref 3.87–5.11)
RDW: 12.1 % (ref 11.5–15.5)
WBC: 11.4 10*3/uL — ABNORMAL HIGH (ref 4.0–10.5)
nRBC: 0 % (ref 0.0–0.2)

## 2022-01-08 LAB — HEPATITIS A ANTIBODY, TOTAL: hep A Total Ab: REACTIVE — AB

## 2022-01-08 LAB — HEMOGLOBIN A1C
Hgb A1c MFr Bld: 4.6 % — ABNORMAL LOW (ref 4.8–5.6)
Mean Plasma Glucose: 85.32 mg/dL

## 2022-01-08 LAB — HEPATITIS B SURFACE ANTIGEN: Hepatitis B Surface Ag: NONREACTIVE

## 2022-01-08 LAB — HEPATITIS C ANTIBODY: HCV Ab: REACTIVE — AB

## 2022-01-08 LAB — HIV ANTIBODY (ROUTINE TESTING W REFLEX): HIV Screen 4th Generation wRfx: NONREACTIVE

## 2022-01-08 MED ORDER — TERCONAZOLE 0.4 % VA CREA: 1.0000 | TOPICAL_CREAM | Freq: Every day | VAGINAL | 0 refills | Status: DC

## 2022-01-08 MED ORDER — CALCIUM CARBONATE ANTACID 500 MG PO CHEW
1.0000 | CHEWABLE_TABLET | Freq: Once | ORAL | Status: AC
Start: 2022-01-08 — End: 2022-01-08
  Administered 2022-01-08: 200 mg via ORAL
  Filled 2022-01-08: qty 1

## 2022-01-08 MED ORDER — NALOXONE HCL 4 MG/0.1ML NA LIQD
NASAL | 0 refills | Status: DC
  Filled 2022-01-08: qty 2, 30d supply, fill #0

## 2022-01-08 MED ORDER — FERROUS SULFATE 325 (65 FE) MG PO TABS: 325.0000 mg | ORAL_TABLET | ORAL | 3 refills | Status: DC

## 2022-01-08 MED ORDER — LOPERAMIDE HCL 2 MG PO CAPS
2.0000 mg | ORAL_CAPSULE | Freq: Four times a day (QID) | ORAL | 0 refills | Status: DC | PRN
  Filled 2022-01-08: qty 12, 3d supply, fill #0

## 2022-01-08 MED ORDER — CEFADROXIL 500 MG PO CAPS: 500.0000 mg | ORAL_CAPSULE | Freq: Two times a day (BID) | ORAL | 0 refills | Status: AC

## 2022-01-08 MED ORDER — CLONIDINE HCL 0.2 MG PO TABS
0.1000 mg | ORAL_TABLET | Freq: Two times a day (BID) | ORAL | 0 refills | Status: DC
Start: 2022-01-08 — End: 2022-02-23
  Filled 2022-01-08: qty 10, 10d supply, fill #0

## 2022-01-08 MED ORDER — BUPRENORPHINE HCL-NALOXONE HCL 8-2 MG SL SUBL
1.0000 | SUBLINGUAL_TABLET | Freq: Every day | SUBLINGUAL | 0 refills | Status: DC
  Filled 2022-01-08: qty 21, 21d supply, fill #0

## 2022-01-08 NOTE — MAU Note (Signed)
Pt asked to go outside to smoke and make a phone call. Told her would rather her wait because pharmacy was coming to talk with her and give her her meds. She said she would be 5 min and left. Notified Provider.

## 2022-01-08 NOTE — MAU Note (Signed)
Is close to her due date.  Had to have c/s with other pregnancy.  Hasn't been getting regular care.  Has been having some Braxton Hicks contractions(every now and then), feels like the baby has dropped, feeling pressure on bladder. Denies bleeding, ? Fluids leaking the past couple wks- only when she goes to the bathroom.  Reports +FM.

## 2022-01-08 NOTE — MAU Note (Addendum)
.  Charlotte Mitchell is a 31 y.o. at [redacted]w[redacted]d here in MAU reporting feeling contractions and wanted to be seen to see if she is in labor.  Described by occasional tightening in ABD and vaginal area.   Patient indicated she has a history of cesarean deliveries with the first being due to Breech presentation and second being abruption and third being repeat. Patient stated she utilizes heroin and fentanyl with last use this morning 01/08/22. She further asked for help with her addiction use and interest in methadone or subutex and wants to Keep this baby.  She states her Father provides care for 2 of her children and Ex Husband has one child.    Vitals:   01/08/22 1030  BP: 109/72  Pulse: 92  Resp: 18  Temp: 97.6 F (36.4 C)  SpO2: 99%     FHT  140 Lab orders placed from triage:  UA, urine Drug screen

## 2022-01-08 NOTE — Progress Notes (Signed)
Patient standing at door indicating she needed to utilize bathroom.  Patient shown how to get to room bathroom, monitors removed. Patient remained in bathroom until 1230.  Upon return to bed, patient had bleeding noted on left hand with a track mark.  RN also noted frequent sniffling of nose. Provider made aware.

## 2022-01-08 NOTE — Social Work (Signed)
CSW received consult for Medicaid assistance, substance user. CSW met with MOB at bedside to complete assessment and offer resources.   CSW reached out [facility] financial Counselor and notified them that the patient needs Medicaid assistance. [facility] financial counselor will follow up with the patient.  CSW notified the [facility] about the patient's medications needs/RNCM to assist.   CSW met with MOB at bedside. CSW observed MOB lying the bed with her eyes closed, she slightly open her eyes when CSW called her name. The patient appeared alert and willing to talk with CSW. CSW introduced the CSW role and explained the reason for the visit. The patient thanked CSW for coming. The patient confirmed the demographic information on file is correct. The patient reported that she came to the hospital to receive medical attention for the baby and that she wants to start taking subutex for SA treatment. The patient reported she last used Heroin and Fentanyl earlier today. The patient reported she has been using heroin and fentanyl for the past four years and the last time she was treatment was a couple years ago. Thea patient reported "things got bad when she started using again and was away from her children." CSW praised the patient for her efforts to receive treatment. CSW provided a list of SA resources in the community and provided information about [facility].   The patient reported that she is about [redacted] weeks pregnant and has not received PNC. The patient reported she discussed starting [facility] and subutex treatment with the doctor. CSW inquired if the patient will have transportation to appointments. The patient reported that she can ride her bike or take the bus if she has money. CSW discussed [place] transportation. MOB was agreeable and stated that she has friends that have used the service. The patient completed the rider waiver form to be placed on hospital file. CSW explained to MOB that [facility] financial counselor  will also complete a Medicaid application on the patient's behalf. The patient was appreciative.   CSW inquired about the patient's other children. The patient shared that her father has custody of her two older children and her ex-husband has custody her youngest child. The patient denied CPS involvement. CSW inquired about MOB supports. The patient reported that the father of the baby is her ex-boyfriend and wants to be involved however she is not sure if she wants him to be involved. The patient reported she lives in a boarding house with roommates will return at discharge. The patient reported that her father is a great support, he helps her financially and purchases groceries when she does not ride her bike to [facility]. The patient reported had food stamps about six months ago, but she let them lapse and did not reinstate it. CSW discussed WIC and provided the patient with contact information to follow up. The patient reported she received [facility] services with her other children so she recalls the process.   CSW encouraged the patient to follow up with the resources provided. The patient reported that she will follow up.   CSW assessed the patient for additional needs. The patient reported no further need.   Kathrin Greathouse, MSW, LCSW Women's and [place] Worker  [phone] 01/08/2022  5:26 PM

## 2022-01-08 NOTE — MAU Note (Signed)
Pt came back and is in room. Pharmacy at bedside with pt and provider reviewing meds.

## 2022-01-08 NOTE — TOC Initial Note (Signed)
Transition of Care [facility]) - Initial/Assessment Note    Patient Details  Name: Charlotte Mitchell MRN: [id number] Date of Birth: [birth year]  Transition of Care [facility]) CM/SW Contact:    Verdell Carmine, RN Phone Number: 01/08/2022, 5:00 PM  Clinical Narrative:                  Called to provide medication assistance. Patient is currently uninsured but could potentially qualify for Medicaid. Will send information to [facility]. MATCH override and redone. Medication assistance can  not be used with suboxone. Discussed with Providers and pharmacy.  TOC will bring patient medications and then she will be DC.        Patient Goals and CMS Choice        Expected Discharge Plan and Services      Home self care                                          Prior Living Arrangements/Services                       Activities of Daily Living Home Assistive Devices/Equipment: None ADL Screening (condition at time of admission) Patient's cognitive ability adequate to safely complete daily activities?: Yes Is the patient deaf or have difficulty hearing?: No Does the patient have difficulty seeing, even when wearing glasses/contacts?: No Does the patient have difficulty concentrating, remembering, or making decisions?: No Patient able to express need for assistance with ADLs?: Yes Does the patient have difficulty dressing or bathing?: No Independently performs ADLs?: Yes (appropriate for developmental age) Does the patient have difficulty walking or climbing stairs?: No Weakness of Legs: None Weakness of Arms/Hands: None  Permission Sought/Granted                  Emotional Assessment              Admission diagnosis:  NO OB CARE, HX C-SECTIONS, WANTS CHECK UP Patient Active Problem List   Diagnosis Date Noted   Paraspinal abscess ([place])    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   Discitis of sacral region 05/22/2018   Heroin addiction ([place]) 10/09/2017   PCP:   Patient, No Pcp Per (Inactive) Pharmacy:   [facility] [place], [address] [place] [address] Phone: [phone] Fax: [phone]  [facility] [place], [address] [place] [address] Phone: [phone] Fax: [phone]  [id number] [place], [place] [address] Phone: [phone] Fax: [phone]  [facility] [phone] - [place], [address] [place] [address] Phone: [phone] Fax: [phone]  [facility] [address] [place] [address] Phone: [phone] Fax: [phone]     Social Determinants of Health (SDOH) Interventions    Readmission Risk Interventions No flowsheet data found.

## 2022-01-08 NOTE — MAU Provider Note (Signed)
History     CSN: [id number]  Arrival date and time: 01/08/22 1007   None     Chief Complaint  Patient presents with   Contractions   HPI Charlotte Mitchell is a 31 y.o. [phone] at [redacted]w[redacted]d by unsure LMP who presents to MAU for a check up. Patient has not had any prenatal care. Her history is significant for previous c-section x3 and IV heroin use. Patient reports she has had intermittent braxton hicks contractions over the last couple of weeks. She denies leaking fluid or vaginal bleeding. No headaches, changes in vision, or RUQ/epigastric pain. She endorses active fetal movement. She reports that she does use heroin daily, last used this morning prior to arrival. She also reports that she occasionally uses "ice". She is currently reporting that she wants help and desires treatment for heroin use. She was previously on Methadone, but relapsed last year. She is currently endorsing that she would like to be started on Subutex as she wants to keep this baby. Her father has custody of her 2 oldest children and her ex-husband has her youngest.  OB History     Gravida  4   Para  3   Term  3   Preterm      AB      Living  3      SAB      IAB      Ectopic      Multiple      Live Births  3           Past Medical History:  Diagnosis Date   Anxiety    IV drug abuse (HCC)    Seizures (HCC) 2016   "from methadone withdrawal"   Substance abuse [facility])     Past Surgical History:  Procedure Laterality Date   APPENDECTOMY     CESAREAN SECTION  07/2008; 11/2006   CESAREAN SECTION  02/03/2012   Procedure: CESAREAN SECTION;  Surgeon: Loney Laurence, MD;  Location: [facility] ORS;  Service: Gynecology;  Laterality: N/A;   I & D EXTREMITY Right 04/10/2021   Procedure: 1.  Irrigation and excisional debridement of right index finger open PIP dislocation;  Surgeon: Ernest Mallick, MD;  Location: [facility] OR;  Service: Orthopedics;  Laterality: Right;   PERCUTANEOUS PINNING Right 04/10/2021    Procedure:  2.  Open reduction and pin fixation of PIP dislocation to the right index finger 3.  Repair of the ulnar collateral ligament of the PIP joint to the right index finger 4.  Neurolysis of the ulnar digital nerve to the right index finger;  Surgeon: Ernest Mallick, MD;  Location: [facility] OR;  Service: Orthopedics;  Laterality: Right;   RADIOLOGY WITH ANESTHESIA N/A 05/22/2018   Procedure: MRI WITH ANESTHESIA;  Surgeon: Radiologist, Medication, MD;  Location: [facility] OR;  Service: Radiology;  Laterality: N/A;   TONSILLECTOMY      History reviewed. No pertinent family history.  Social History   Tobacco Use   Smoking status: Every Day    Packs/day: 1.00    Years: 16.00    Pack years: 16.00    Types: Cigarettes   Smokeless tobacco: Never  Vaping Use   Vaping Use: Former  Substance Use Topics   Alcohol use: Not Currently   Drug use: Yes    Types: Heroin, "Crack" cocaine, Fentanyl    Comment: Last used 01/08/22    Allergies:  Allergies  Allergen Reactions   Hydrocodone Itching   Zofran Frazier Richards  Hcl] Other (See Comments)    "Blacked out for 6 hours"    No medications prior to admission.    Review of Systems  Constitutional: Negative.   Respiratory: Negative.    Cardiovascular: Negative.   Gastrointestinal:  Positive for abdominal pain (contractions).  Genitourinary: Negative.   Musculoskeletal: Negative.   Neurological: Negative.    Physical Exam   Blood pressure 118/72, pulse 78, temperature 97.6 F (36.4 C), temperature source Oral, resp. rate 18, height 5\' 4"  (1.626 m), weight 74 kg, last menstrual period 05/03/2021, SpO2 99 %, unknown if currently breastfeeding.  Physical Exam Vitals and nursing note reviewed.  Constitutional:      General: She is not in acute distress. Eyes:     Extraocular Movements: Extraocular movements intact.     Pupils: Pupils are equal, round, and reactive to light.  Cardiovascular:     Rate and Rhythm: Normal rate.  Pulmonary:      Effort: Pulmonary effort is normal.  Abdominal:     Palpations: Abdomen is soft.     Tenderness: There is no abdominal tenderness.     Comments: Gravid: fundal height 36cm  Genitourinary:    Comments: Blind swabs and GBS culture collected VE: closed/thick Musculoskeletal:        General: Normal range of motion.     Cervical back: Normal range of motion.  Skin:    General: Skin is warm and dry.     Comments: Multiple track marks noted on bilateral upper extremities  Neurological:     General: No focal deficit present.     Mental Status: She is alert and oriented to person, place, and time.  Psychiatric:        Mood and Affect: Mood normal.     Comments: Patient appears intoxicated   NST FHR: 125 bpm, moderate variability, +15x15 accels, no decels Toco: quiet     MAU Course  Procedures NST UA, culture pending UDS Prenatal panel Wet prep, GC/CT, GBS culture US  [facility]  MDM 1200: I notified Dr. Crissie ReeseEckstat of patient and that she was interested in suboxone.  1240: patient's RN notified provider that patient had been in the bathroom for approximately 30 mins. When patient came out of the bathroom she was noted to be "sniffing a lot" and had "fresh blood on hand with a new track mark".  1330: NST reactive and reassuring, toco quiet. Patient to ultrasound. Prelim results reviewed. VE: closed/thick 1500: D/w Dr. Crissie ReeseEckstat UDS results and possible initiation of suboxone. Will rx suboxone 8-2 tabs for a total of 21 tabs, narcan, immodium, zofran, and clonidine per Dr. Audie ClearEckstat's recommendation. Transitions of care pharmacy aware and preparing prescriptions. Patient has used suboxone in the past and is aware of self titration. I reiterated the importance slowly increasing and that she has to wait until withdrawal before starting it. Patient verbalizes understanding. 1630: SW at bedside to discuss Medicaid application and transportation. See their note for  further details. 1656: patient walked off unit for cigarette break. Patient returned back 7 minutes later. TOC pharmacy at bedside to drop off medications. Reviewed medications at length with patient. Patient will also be rx'd antibiotic for UTI, terazol cream for yeast infection, and PO iron supplement At this time, I feel patient is stable for discharge home. No signs of active labor, patient not experiencing withdrawal symptoms, NST reactive and reassuring for gestational age. All questions asked by patient were answered.   Assessment and Plan  [redacted] weeks  gestation of pregnancy Previous c-section x3  No prenatal care Substance abuse affecting pregnancy UTI affecting pregnancy Anemia affecting pregnancy Yeast infection   - Discharge home in stable condition. Patient's father provided transportation - [facility] pharmacy brought medications to bedside. Reviewed meds at length with patient - Strict return precautions reviewed at length. Return to MAU sooner or as needed for worsening symptoms - Per Dr. Crissie Reese, patient to go to [facility] on Wednesday at 3:35pm for appointment with him. Message sent to [facility] notifying them of patient appt time   Brand Males, CNM 01/08/2022, 7:56 PM

## 2022-01-08 NOTE — Discharge Instructions (Signed)

## 2022-01-09 LAB — RPR: RPR Ser Ql: NONREACTIVE

## 2022-01-09 LAB — HEPATITIS B SURFACE ANTIBODY, QUANTITATIVE: Hep B S AB Quant (Post): 167.3 m[IU]/mL (ref >9.9)

## 2022-01-09 LAB — RUBELLA SCREEN: Rubella: 2.57 index (ref >0.99)

## 2022-01-10 LAB — CULTURE, BETA STREP (GROUP B ONLY)

## 2022-01-11 LAB — CULTURE, OB URINE: Culture: >=100000 — AB

## 2022-01-11 LAB — GC/CHLAMYDIA PROBE AMP (~~LOC~~) NOT AT ARMC
Chlamydia: NEGATIVE
Comment: NEGATIVE
Comment: NORMAL
Neisseria Gonorrhea: NEGATIVE

## 2022-01-13 ENCOUNTER — Encounter: Payer: Self-pay | Admitting: Family Medicine

## 2022-02-03 DIAGNOSIS — Z419 Encounter for procedure for purposes other than remedying health state, unspecified: Secondary | ICD-10-CM | POA: Diagnosis not present

## 2022-02-22 ENCOUNTER — Inpatient Hospital Stay (HOSPITAL_COMMUNITY): Payer: Medicaid Other | Admitting: Anesthesiology

## 2022-02-22 ENCOUNTER — Encounter (HOSPITAL_COMMUNITY)
Admission: AD | Disposition: A | Discharge status: Home / Self Care | Payer: Self-pay | Source: Home / Self Care | Attending: Family Medicine

## 2022-02-22 ENCOUNTER — Other Ambulatory Visit: Payer: Self-pay

## 2022-02-22 ENCOUNTER — Inpatient Hospital Stay (HOSPITAL_COMMUNITY)
Admission: AD | Admit: 2022-02-22 | Discharge: 2022-02-24 | DRG: 787 | Disposition: A | Discharge status: Home / Self Care | Payer: Medicaid Other | Attending: Family Medicine | Admitting: Family Medicine

## 2022-02-22 ENCOUNTER — Encounter (HOSPITAL_COMMUNITY): Payer: Self-pay | Admitting: Family Medicine

## 2022-02-22 DIAGNOSIS — O26893 Other specified pregnancy related conditions, third trimester: Secondary | ICD-10-CM | POA: Diagnosis present

## 2022-02-22 DIAGNOSIS — O0990 Supervision of high risk pregnancy, unspecified, unspecified trimester: Secondary | ICD-10-CM

## 2022-02-22 DIAGNOSIS — F17210 Nicotine dependence, cigarettes, uncomplicated: Secondary | ICD-10-CM | POA: Diagnosis not present

## 2022-02-22 DIAGNOSIS — O34211 Maternal care for low transverse scar from previous cesarean delivery: Secondary | ICD-10-CM | POA: Diagnosis present

## 2022-02-22 DIAGNOSIS — O4290 Premature rupture of membranes, unspecified as to length of time between rupture and onset of labor, unspecified weeks of gestation: Principal | ICD-10-CM | POA: Diagnosis present

## 2022-02-22 DIAGNOSIS — O99324 Drug use complicating childbirth: Secondary | ICD-10-CM | POA: Diagnosis not present

## 2022-02-22 DIAGNOSIS — Z3A40 40 weeks gestation of pregnancy: Secondary | ICD-10-CM

## 2022-02-22 DIAGNOSIS — R030 Elevated blood-pressure reading, without diagnosis of hypertension: Secondary | ICD-10-CM | POA: Diagnosis present

## 2022-02-22 DIAGNOSIS — O99893 Other specified diseases and conditions complicating puerperium: Secondary | ICD-10-CM | POA: Diagnosis not present

## 2022-02-22 DIAGNOSIS — O480 Post-term pregnancy: Secondary | ICD-10-CM | POA: Diagnosis present

## 2022-02-22 DIAGNOSIS — O4202 Full-term premature rupture of membranes, onset of labor within 24 hours of rupture: Secondary | ICD-10-CM | POA: Diagnosis not present

## 2022-02-22 DIAGNOSIS — D62 Acute posthemorrhagic anemia: Secondary | ICD-10-CM | POA: Diagnosis not present

## 2022-02-22 DIAGNOSIS — Y92238 Other place in hospital as the place of occurrence of the external cause: Secondary | ICD-10-CM | POA: Diagnosis not present

## 2022-02-22 DIAGNOSIS — Z98891 History of uterine scar from previous surgery: Secondary | ICD-10-CM

## 2022-02-22 DIAGNOSIS — O99344 Other mental disorders complicating childbirth: Secondary | ICD-10-CM | POA: Diagnosis not present

## 2022-02-22 DIAGNOSIS — F1490 Cocaine use, unspecified, uncomplicated: Secondary | ICD-10-CM | POA: Diagnosis present

## 2022-02-22 DIAGNOSIS — O9081 Anemia of the puerperium: Secondary | ICD-10-CM | POA: Diagnosis not present

## 2022-02-22 DIAGNOSIS — O479 False labor, unspecified: Secondary | ICD-10-CM | POA: Diagnosis not present

## 2022-02-22 DIAGNOSIS — O4292 Full-term premature rupture of membranes, unspecified as to length of time between rupture and onset of labor: Principal | ICD-10-CM | POA: Diagnosis present

## 2022-02-22 DIAGNOSIS — F1120 Opioid dependence, uncomplicated: Secondary | ICD-10-CM | POA: Diagnosis not present

## 2022-02-22 DIAGNOSIS — O99334 Smoking (tobacco) complicating childbirth: Secondary | ICD-10-CM | POA: Diagnosis present

## 2022-02-22 DIAGNOSIS — W010XXA Fall on same level from slipping, tripping and stumbling without subsequent striking against object, initial encounter: Secondary | ICD-10-CM | POA: Diagnosis not present

## 2022-02-22 DIAGNOSIS — F419 Anxiety disorder, unspecified: Secondary | ICD-10-CM | POA: Diagnosis not present

## 2022-02-22 LAB — BLOOD GAS, ARTERIAL
Acid-Base Excess: 0.2 mmol/L (ref 0.0–2.0)
Bicarbonate: 23.3 mmol/L (ref 20.0–28.0)
Drawn by: 22371
O2 Saturation: 99.5 %
Patient temperature: 37
pCO2 arterial: 32 mmHg (ref 32–48)
pH, Arterial: 7.47 — ABNORMAL HIGH (ref 7.35–7.45)
pO2, Arterial: 319 mmHg — ABNORMAL HIGH (ref 83–108)

## 2022-02-22 LAB — RAPID URINE DRUG SCREEN, HOSP PERFORMED
Amphetamines: NOT DETECTED
Barbiturates: NOT DETECTED
Benzodiazepines: NOT DETECTED
Cocaine: POSITIVE — AB
Opiates: NOT DETECTED
Tetrahydrocannabinol: NOT DETECTED

## 2022-02-22 LAB — COMPREHENSIVE METABOLIC PANEL
ALT: 8 U/L (ref 0–44)
AST: 14 U/L — ABNORMAL LOW (ref 15–41)
Albumin: 1.8 g/dL — ABNORMAL LOW (ref 3.5–5.0)
Alkaline Phosphatase: 214 U/L — ABNORMAL HIGH (ref 38–126)
Anion gap: 8 (ref 5–15)
BUN: 6 mg/dL (ref 6–20)
CO2: 22 mmol/L (ref 22–32)
Calcium: 7.7 mg/dL — ABNORMAL LOW (ref 8.9–10.3)
Chloride: 106 mmol/L (ref 98–111)
Creatinine, Ser: 0.71 mg/dL (ref 0.44–1.00)
GFR, Estimated: >60 mL/min (ref >60)
Glucose, Bld: 111 mg/dL — ABNORMAL HIGH (ref 70–99)
Potassium: 3.5 mmol/L (ref 3.5–5.1)
Sodium: 136 mmol/L (ref 135–145)
Total Bilirubin: <0.1 mg/dL — ABNORMAL LOW (ref 0.3–1.2)
Total Protein: 4.9 g/dL — ABNORMAL LOW (ref 6.5–8.1)

## 2022-02-22 LAB — CBC
HCT: 33.4 % — ABNORMAL LOW (ref 36.0–46.0)
Hemoglobin: 10.9 g/dL — ABNORMAL LOW (ref 12.0–15.0)
MCH: 31.7 pg (ref 26.0–34.0)
MCHC: 32.6 g/dL (ref 30.0–36.0)
MCV: 97.1 fL (ref 80.0–100.0)
Platelets: 152 10*3/uL (ref 150–400)
RBC: 3.44 MIL/uL — ABNORMAL LOW (ref 3.87–5.11)
RDW: 12.9 % (ref 11.5–15.5)
WBC: 8.6 10*3/uL (ref 4.0–10.5)
nRBC: 0 % (ref 0.0–0.2)

## 2022-02-22 LAB — TYPE AND SCREEN
ABO/RH(D): O POS
Antibody Screen: NEGATIVE

## 2022-02-22 LAB — PROTEIN / CREATININE RATIO, URINE
Creatinine, Urine: 76.81 mg/dL
Protein Creatinine Ratio: 0.13 mg/mg{Cre} (ref 0.00–0.15)
Total Protein, Urine: 10 mg/dL

## 2022-02-22 LAB — HIV ANTIBODY (ROUTINE TESTING W REFLEX): HIV Screen 4th Generation wRfx: NONREACTIVE

## 2022-02-22 LAB — HEPATITIS B SURFACE ANTIGEN: Hepatitis B Surface Ag: NONREACTIVE

## 2022-02-22 SURGERY — Surgical Case
Anesthesia: General

## 2022-02-22 MED ORDER — GLYCOPYRROLATE PF 0.2 MG/ML IJ SOSY
PREFILLED_SYRINGE | INTRAMUSCULAR | Status: AC
  Filled 2022-02-22: qty 1

## 2022-02-22 MED ORDER — HYDROMORPHONE HCL 1 MG/ML IJ SOLN
INTRAMUSCULAR | Status: AC
Start: 2022-02-22 — End: ?
  Filled 2022-02-22: qty 0.5

## 2022-02-22 MED ORDER — ENOXAPARIN SODIUM 40 MG/0.4ML IJ SOSY
40.0000 mg | PREFILLED_SYRINGE | INTRAMUSCULAR | Status: DC
  Administered 2022-02-23 – 2022-02-24 (×2): 40 mg via SUBCUTANEOUS
  Filled 2022-02-22 (×2): qty 0.4

## 2022-02-22 MED ORDER — LACTATED RINGERS IV SOLN: INTRAVENOUS | Status: DC | PRN

## 2022-02-22 MED ORDER — DEXAMETHASONE SODIUM PHOSPHATE 4 MG/ML IJ SOLN
INTRAMUSCULAR | Status: DC | PRN
  Administered 2022-02-22: 10 mg via INTRAVENOUS

## 2022-02-22 MED ORDER — MIDAZOLAM HCL 2 MG/2ML IJ SOLN
INTRAMUSCULAR | Status: AC
  Filled 2022-02-22: qty 2

## 2022-02-22 MED ORDER — HYDROMORPHONE HCL 1 MG/ML IJ SOLN
INTRAMUSCULAR | Status: AC
  Filled 2022-02-22: qty 0.5

## 2022-02-22 MED ORDER — SUCCINYLCHOLINE CHLORIDE 200 MG/10ML IV SOSY
PREFILLED_SYRINGE | INTRAVENOUS | Status: DC | PRN
  Administered 2022-02-22: 100 mg via INTRAVENOUS

## 2022-02-22 MED ORDER — ONDANSETRON HCL 4 MG/2ML IJ SOLN
INTRAMUSCULAR | Status: DC | PRN
  Administered 2022-02-22: 8 mg via INTRAVENOUS

## 2022-02-22 MED ORDER — FENTANYL CITRATE (PF) 250 MCG/5ML IJ SOLN
INTRAMUSCULAR | Status: DC | PRN
  Administered 2022-02-22: 100 ug via INTRAVENOUS
  Administered 2022-02-22: 250 ug via INTRAVENOUS
  Administered 2022-02-22: 150 ug via INTRAVENOUS

## 2022-02-22 MED ORDER — SENNOSIDES-DOCUSATE SODIUM 8.6-50 MG PO TABS
2.0000 | ORAL_TABLET | ORAL | Status: DC
  Administered 2022-02-23 – 2022-02-24 (×2): 2 via ORAL
  Filled 2022-02-22 (×2): qty 2

## 2022-02-22 MED ORDER — FENTANYL CITRATE (PF) 250 MCG/5ML IJ SOLN
INTRAMUSCULAR | Status: AC
  Filled 2022-02-22: qty 5

## 2022-02-22 MED ORDER — POVIDONE-IODINE 10 % EX SWAB: 2.0000 "application " | Freq: Once | CUTANEOUS | Status: DC

## 2022-02-22 MED ORDER — METHADONE HCL 10 MG/ML PO CONC
10.0000 mg | ORAL | Status: AC | PRN
  Administered 2022-02-22 – 2022-02-23 (×2): 10 mg via ORAL
  Filled 2022-02-22 (×2): qty 5

## 2022-02-22 MED ORDER — DIPHENHYDRAMINE HCL 25 MG PO CAPS: 25.0000 mg | ORAL_CAPSULE | Freq: Four times a day (QID) | ORAL | Status: DC | PRN

## 2022-02-22 MED ORDER — FUROSEMIDE 20 MG PO TABS
20.0000 mg | ORAL_TABLET | Freq: Two times a day (BID) | ORAL | Status: DC
  Administered 2022-02-23 – 2022-02-24 (×4): 20 mg via ORAL
  Filled 2022-02-22 (×4): qty 1

## 2022-02-22 MED ORDER — COCONUT OIL OIL: 1.0000 "application " | TOPICAL_OIL | Status: DC | PRN

## 2022-02-22 MED ORDER — MEPERIDINE HCL 25 MG/ML IJ SOLN
INTRAMUSCULAR | Status: AC
Start: 2022-02-22 — End: ?
  Filled 2022-02-22: qty 1

## 2022-02-22 MED ORDER — OXYTOCIN-SODIUM CHLORIDE 30-0.9 UT/500ML-% IV SOLN
2.5000 [IU]/h | INTRAVENOUS | Status: AC
  Administered 2022-02-22: 2.5 [IU]/h via INTRAVENOUS
  Filled 2022-02-22: qty 500

## 2022-02-22 MED ORDER — HYDROMORPHONE HCL 1 MG/ML IJ SOLN
0.2500 mg | INTRAMUSCULAR | Status: DC | PRN
  Administered 2022-02-22 (×4): 0.5 mg via INTRAVENOUS

## 2022-02-22 MED ORDER — HYDROMORPHONE HCL 1 MG/ML IJ SOLN
INTRAMUSCULAR | Status: AC
  Filled 2022-02-22: qty 1

## 2022-02-22 MED ORDER — PROPOFOL 10 MG/ML IV BOLUS
INTRAVENOUS | Status: AC
  Filled 2022-02-22: qty 20

## 2022-02-22 MED ORDER — MAGNESIUM HYDROXIDE 400 MG/5ML PO SUSP: 30.0000 mL | ORAL | Status: DC | PRN

## 2022-02-22 MED ORDER — TRANEXAMIC ACID-NACL 1000-0.7 MG/100ML-% IV SOLN
INTRAVENOUS | Status: DC | PRN
  Administered 2022-02-22: 1000 mg via INTRAVENOUS

## 2022-02-22 MED ORDER — LIDOCAINE 2% (20 MG/ML) 5 ML SYRINGE
INTRAMUSCULAR | Status: AC
  Filled 2022-02-22: qty 5

## 2022-02-22 MED ORDER — CEFAZOLIN SODIUM-DEXTROSE 2-3 GM-%(50ML) IV SOLR
INTRAVENOUS | Status: DC | PRN
  Administered 2022-02-22: 2 g via INTRAVENOUS

## 2022-02-22 MED ORDER — METOCLOPRAMIDE HCL 5 MG/ML IJ SOLN
INTRAMUSCULAR | Status: DC | PRN
  Administered 2022-02-22: 10 mg via INTRAVENOUS

## 2022-02-22 MED ORDER — NEOSTIGMINE METHYLSULFATE 3 MG/3ML IV SOSY
PREFILLED_SYRINGE | INTRAVENOUS | Status: AC
  Filled 2022-02-22: qty 3

## 2022-02-22 MED ORDER — IBUPROFEN 600 MG PO TABS
600.0000 mg | ORAL_TABLET | Freq: Four times a day (QID) | ORAL | Status: DC
  Administered 2022-02-23 – 2022-02-24 (×5): 600 mg via ORAL
  Filled 2022-02-22 (×4): qty 1

## 2022-02-22 MED ORDER — SODIUM CHLORIDE (PF) 0.9 % IJ SOLN
INTRAMUSCULAR | Status: AC
  Filled 2022-02-22: qty 10

## 2022-02-22 MED ORDER — DIBUCAINE (PERIANAL) 1 % EX OINT: 1.0000 "application " | TOPICAL_OINTMENT | CUTANEOUS | Status: DC | PRN

## 2022-02-22 MED ORDER — PROPOFOL 10 MG/ML IV BOLUS
INTRAVENOUS | Status: DC | PRN
  Administered 2022-02-22 (×2): 50 mg via INTRAVENOUS
  Administered 2022-02-22: 170 mg via INTRAVENOUS
  Administered 2022-02-22: 30 mg via INTRAVENOUS

## 2022-02-22 MED ORDER — HYDROMORPHONE HCL 1 MG/ML IJ SOLN
INTRAMUSCULAR | Status: DC | PRN
  Administered 2022-02-22: 2 mg via INTRAVENOUS

## 2022-02-22 MED ORDER — SIMETHICONE 80 MG PO CHEW
80.0000 mg | CHEWABLE_TABLET | Freq: Three times a day (TID) | ORAL | Status: DC
  Administered 2022-02-23 – 2022-02-24 (×5): 80 mg via ORAL
  Filled 2022-02-22 (×5): qty 1

## 2022-02-22 MED ORDER — SOD CITRATE-CITRIC ACID 500-334 MG/5ML PO SOLN: 30.0000 mL | ORAL | Status: DC

## 2022-02-22 MED ORDER — NIFEDIPINE ER OSMOTIC RELEASE 30 MG PO TB24
30.0000 mg | ORAL_TABLET | Freq: Every day | ORAL | Status: DC
  Administered 2022-02-23 (×2): 30 mg via ORAL
  Filled 2022-02-22 (×2): qty 1

## 2022-02-22 MED ORDER — HYDROMORPHONE HCL 1 MG/ML IJ SOLN
INTRAMUSCULAR | Status: AC
Start: 2022-02-22 — End: ?
  Filled 2022-02-22: qty 1

## 2022-02-22 MED ORDER — DEXMEDETOMIDINE (PRECEDEX) IN NS 20 MCG/5ML (4 MCG/ML) IV SYRINGE
PREFILLED_SYRINGE | INTRAVENOUS | Status: AC
  Filled 2022-02-22: qty 5

## 2022-02-22 MED ORDER — ROCURONIUM BROMIDE 10 MG/ML (PF) SYRINGE
PREFILLED_SYRINGE | INTRAVENOUS | Status: AC
  Filled 2022-02-22: qty 10

## 2022-02-22 MED ORDER — CEFAZOLIN SODIUM-DEXTROSE 2-4 GM/100ML-% IV SOLN
2.0000 g | INTRAVENOUS | Status: AC
  Filled 2022-02-22: qty 100

## 2022-02-22 MED ORDER — OXYCODONE HCL 5 MG/5ML PO SOLN: 5.0000 mg | Freq: Once | ORAL | Status: DC | PRN

## 2022-02-22 MED ORDER — MENTHOL 3 MG MT LOZG
1.0000 | LOZENGE | OROMUCOSAL | Status: DC | PRN
  Filled 2022-02-22: qty 9

## 2022-02-22 MED ORDER — ARTIFICIAL TEARS OPHTHALMIC OINT
TOPICAL_OINTMENT | OPHTHALMIC | Status: AC
  Filled 2022-02-22: qty 3.5

## 2022-02-22 MED ORDER — HYDROMORPHONE HCL 1 MG/ML IJ SOLN
0.2000 mg | INTRAMUSCULAR | Status: DC | PRN
  Administered 2022-02-22 – 2022-02-23 (×2): 0.5 mg via INTRAVENOUS
  Administered 2022-02-23: 0.6 mg via INTRAVENOUS
  Filled 2022-02-22 (×3): qty 1

## 2022-02-22 MED ORDER — FENTANYL CITRATE (PF) 100 MCG/2ML IJ SOLN: INTRAMUSCULAR | Status: DC | PRN

## 2022-02-22 MED ORDER — TRANEXAMIC ACID-NACL 1000-0.7 MG/100ML-% IV SOLN
INTRAVENOUS | Status: AC
  Filled 2022-02-22: qty 100

## 2022-02-22 MED ORDER — OXYTOCIN-SODIUM CHLORIDE 30-0.9 UT/500ML-% IV SOLN
INTRAVENOUS | Status: DC | PRN
  Administered 2022-02-22: 250 mL via INTRAVENOUS

## 2022-02-22 MED ORDER — SUCCINYLCHOLINE CHLORIDE 200 MG/10ML IV SOSY
PREFILLED_SYRINGE | INTRAVENOUS | Status: AC
  Filled 2022-02-22: qty 10

## 2022-02-22 MED ORDER — LACTATED RINGERS IV SOLN: INTRAVENOUS | Status: DC

## 2022-02-22 MED ORDER — LIDOCAINE-EPINEPHRINE (PF) 2 %-1:200000 IJ SOLN
INTRAMUSCULAR | Status: DC | PRN
  Administered 2022-02-22: 60 mg via INTRADERMAL

## 2022-02-22 MED ORDER — MEPERIDINE HCL 25 MG/ML IJ SOLN
12.5000 mg | Freq: Once | INTRAMUSCULAR | Status: AC
  Administered 2022-02-22: 12.5 mg via INTRAVENOUS

## 2022-02-22 MED ORDER — WITCH HAZEL-GLYCERIN EX PADS: 1.0000 "application " | MEDICATED_PAD | CUTANEOUS | Status: DC | PRN

## 2022-02-22 MED ORDER — KETOROLAC TROMETHAMINE 30 MG/ML IJ SOLN: 30.0000 mg | Freq: Once | INTRAMUSCULAR | Status: DC

## 2022-02-22 MED ORDER — PRENATAL MULTIVITAMIN CH
1.0000 | ORAL_TABLET | Freq: Every day | ORAL | Status: DC
  Administered 2022-02-23 – 2022-02-24 (×2): 1 via ORAL
  Filled 2022-02-22 (×2): qty 1

## 2022-02-22 MED ORDER — STERILE WATER FOR IRRIGATION IR SOLN
Status: DC | PRN
  Administered 2022-02-22: 1000 mL

## 2022-02-22 MED ORDER — SIMETHICONE 80 MG PO CHEW: 80.0000 mg | CHEWABLE_TABLET | ORAL | Status: DC | PRN

## 2022-02-22 MED ORDER — ONDANSETRON HCL 4 MG/2ML IJ SOLN: 4.0000 mg | Freq: Once | INTRAMUSCULAR | Status: DC | PRN

## 2022-02-22 MED ORDER — DEXMEDETOMIDINE (PRECEDEX) IN NS 20 MCG/5ML (4 MCG/ML) IV SYRINGE
PREFILLED_SYRINGE | INTRAVENOUS | Status: DC | PRN
  Administered 2022-02-22: 4 ug via INTRAVENOUS
  Administered 2022-02-22 (×2): 8 ug via INTRAVENOUS

## 2022-02-22 MED ORDER — PHENYLEPHRINE 40 MCG/ML (10ML) SYRINGE FOR IV PUSH (FOR BLOOD PRESSURE SUPPORT)
PREFILLED_SYRINGE | INTRAVENOUS | Status: AC
  Filled 2022-02-22: qty 10

## 2022-02-22 MED ORDER — KETOROLAC TROMETHAMINE 30 MG/ML IJ SOLN
INTRAMUSCULAR | Status: AC
  Filled 2022-02-22: qty 1

## 2022-02-22 MED ORDER — ONDANSETRON HCL 4 MG/2ML IJ SOLN
INTRAMUSCULAR | Status: AC
  Filled 2022-02-22: qty 2

## 2022-02-22 MED ORDER — PHENYLEPHRINE HCL (PRESSORS) 10 MG/ML IV SOLN
INTRAVENOUS | Status: DC | PRN
  Administered 2022-02-22: 80 ug via INTRAVENOUS
  Administered 2022-02-22: 120 ug via INTRAVENOUS
  Administered 2022-02-22 (×2): 80 ug via INTRAVENOUS

## 2022-02-22 MED ORDER — SODIUM CHLORIDE 0.9 % IV SOLN
INTRAVENOUS | Status: AC
  Filled 2022-02-22: qty 5

## 2022-02-22 MED ORDER — SODIUM CHLORIDE 0.9 % IV SOLN
500.0000 mg | INTRAVENOUS | Status: AC
  Administered 2022-02-22: 500 mg via INTRAVENOUS
  Filled 2022-02-22: qty 5

## 2022-02-22 MED ORDER — OXYCODONE HCL 5 MG PO TABS: 5.0000 mg | ORAL_TABLET | Freq: Once | ORAL | Status: DC | PRN

## 2022-02-22 MED ORDER — KETOROLAC TROMETHAMINE 30 MG/ML IJ SOLN
30.0000 mg | Freq: Once | INTRAMUSCULAR | Status: AC | PRN
  Administered 2022-02-22: 30 mg via INTRAVENOUS

## 2022-02-22 MED ORDER — KETOROLAC TROMETHAMINE 30 MG/ML IJ SOLN
30.0000 mg | Freq: Four times a day (QID) | INTRAMUSCULAR | Status: AC
  Administered 2022-02-23 (×3): 30 mg via INTRAVENOUS
  Filled 2022-02-22 (×3): qty 1

## 2022-02-22 SURGICAL SUPPLY — 37 items
CHLORAPREP W/TINT 26ML (MISCELLANEOUS) ×2 IMPLANT
CLAMP CORD UMBIL (MISCELLANEOUS) IMPLANT
CLOTH BEACON ORANGE TIMEOUT ST (SAFETY) ×2 IMPLANT
DRSG OPSITE POSTOP 4X10 (GAUZE/BANDAGES/DRESSINGS) ×2 IMPLANT
ELECT REM PT RETURN 9FT ADLT (ELECTROSURGICAL) ×2
ELECTRODE REM PT RTRN 9FT ADLT (ELECTROSURGICAL) ×1 IMPLANT
EXTRACTOR VACUUM BELL STYLE (SUCTIONS) IMPLANT
GAUZE SPONGE 4X4 12PLY STRL (GAUZE/BANDAGES/DRESSINGS) ×2 IMPLANT
GLOVE BIOGEL PI IND STRL 7.0 (GLOVE) ×2 IMPLANT
GLOVE BIOGEL PI INDICATOR 7.0 (GLOVE) ×2
GLOVE ECLIPSE 7.0 STRL STRAW (GLOVE) ×2 IMPLANT
GOWN STRL REUS W/TWL LRG LVL3 (GOWN DISPOSABLE) ×4 IMPLANT
HEMOSTAT ARISTA ABSORB 3G PWDR (HEMOSTASIS) ×2 IMPLANT
KIT ABG SYR 3ML LUER SLIP (SYRINGE) ×2 IMPLANT
NDL HYPO 25X5/8 SAFETYGLIDE (NEEDLE) ×1 IMPLANT
NEEDLE HYPO 25X5/8 SAFETYGLIDE (NEEDLE) ×2 IMPLANT
NS IRRIG 1000ML POUR BTL (IV SOLUTION) ×2 IMPLANT
PACK C SECTION WH (CUSTOM PROCEDURE TRAY) ×2 IMPLANT
PAD ABD 8X10 STRL (GAUZE/BANDAGES/DRESSINGS) ×1 IMPLANT
PAD OB MATERNITY 4.3X12.25 (PERSONAL CARE ITEMS) ×2 IMPLANT
PENCIL SMOKE EVAC W/HOLSTER (ELECTROSURGICAL) ×2 IMPLANT
RTRCTR C-SECT PINK 25CM LRG (MISCELLANEOUS) ×2 IMPLANT
SUT CHROMIC 2 0 CT 1 (SUTURE) ×1 IMPLANT
SUT MNCRL 0 VIOLET CTX 36 (SUTURE) ×2 IMPLANT
SUT MONOCRYL 0 CTX 36 (SUTURE) ×4
SUT PLAIN 0 NONE (SUTURE) IMPLANT
SUT PLAIN 2 0 (SUTURE)
SUT PLAIN 2 0 XLH (SUTURE) IMPLANT
SUT PLAIN ABS 2-0 CT1 27XMFL (SUTURE) IMPLANT
SUT VIC AB 0 CTX 36 (SUTURE) ×2
SUT VIC AB 0 CTX36XBRD ANBCTRL (SUTURE) ×1 IMPLANT
SUT VIC AB 2-0 CT1 27 (SUTURE) ×2
SUT VIC AB 2-0 CT1 TAPERPNT 27 (SUTURE) ×1 IMPLANT
SUT VIC AB 4-0 KS 27 (SUTURE) ×2 IMPLANT
TOWEL OR 17X24 6PK STRL BLUE (TOWEL DISPOSABLE) ×2 IMPLANT
TRAY FOLEY W/BAG SLVR 14FR LF (SET/KITS/TRAYS/PACK) IMPLANT
WATER STERILE IRR 1000ML POUR (IV SOLUTION) ×2 IMPLANT

## 2022-02-22 NOTE — Anesthesia Procedure Notes (Signed)
Procedure Name: Intubation ?Date/Time: 02/22/2022 2:15 PM ?Performed by: Graciela Husbands, CRNA ?Pre-anesthesia Checklist: Patient identified, Emergency Drugs available, Suction available and Patient being monitored ?Patient Re-evaluated:Patient Re-evaluated prior to induction ?Oxygen Delivery Method: Circle system utilized ?Preoxygenation: Pre-oxygenation with 100% oxygen ?Induction Type: IV induction, Cricoid Pressure applied and Rapid sequence ?Ventilation: Mask ventilation without difficulty ?Laryngoscope Size: Glidescope and 3 ?Grade View: Grade I ?Tube type: Oral ?Tube size: 7.0 mm ?Number of attempts: 1 ?Airway Equipment and Method: Stylet and Oral airway ?Placement Confirmation: ETT inserted through vocal cords under direct vision, positive ETCO2 and breath sounds checked- equal and bilateral ?Secured at: 21 cm ?Tube secured with: Tape ?Dental Injury: Teeth and Oropharynx as per pre-operative assessment  ? ? ? ? ?

## 2022-02-22 NOTE — OB Triage Provider Note (Signed)
Charlotte Mitchell is a 31 y.o. [phone] at [redacted]w[redacted]d who presents via EMS for ROM this morning. Unsure of time. Reporting contractions every few minutes. Denies vaginal bleeding. Endorses active fetal movement. ? ?Upon arrival, moderate amount of meconium-stained fluid noted on pad. Cervix examined by me and patient found to be 10/100/0, +1, vertex. FHR 150's bpm. Patient has a prior history of c-section x3. Patient also reports using heroin at 0900 this morning. Last ate last night. Dr. Crissie Reese immediately notified and present at bedside to consent for repeat c-section. Patient take via stretcher to OR with RN, CNM, and Dr. Crissie Reese.  ? ? ? ?Charlotte Mitchell, CNM ?02/22/22 ?2:14 PM ? ?

## 2022-02-22 NOTE — Transfer of Care (Signed)
Immediate Anesthesia Transfer of Care Note ? ?Patient: [name] ? ?Procedure(s) Performed: CESAREAN SECTION ? ?Patient Location: PACU ? ?Anesthesia Type:General ? ?Level of Consciousness: awake, alert  and oriented ? ?Airway & Oxygen Therapy: Patient Spontanous Breathing ? ?Post-op Assessment: Report given to RN and Post -op Vital signs reviewed and stable ? ?Post vital signs: Reviewed and stable ? ?Last Vitals:  ?Vitals Value Taken Time  ?BP 165/131 02/22/22 1547  ?Temp    ?Pulse 67 02/22/22 1556  ?Resp 18 02/22/22 1556  ?SpO2 100 % 02/22/22 1556  ?Vitals shown include unvalidated device data. ? ?Last Pain:  ?Vitals:  ? 02/22/22 1355  ?TempSrc:   ?PainSc: 8   ?   ? ?  ? ?Complications: No notable events documented. ?

## 2022-02-22 NOTE — H&P (Signed)
?LABOR AND DELIVERY ADMISSION HISTORY AND PHYSICAL NOTE ? ?Charlotte Mitchell is a 31 y.o. female [phone] with IUP at 6532w3d presenting with SROM and active labor.  ? ?Patient encoded to MAU. On arrival found to be fully dilated at 0 station and uncomfortable. Reports active opioid use, last use around 0900 this morning.  ? ?Prenatal History/Complications: ?PNC: none ? ?Sono:  @[redacted]w[redacted]d , CWD, normal anatomy, cephalic presentation, posterior placenta, 48%ile, EFW 2365g ? ?Pregnancy complications:  ?- substance use disorder ?- no prenatal care ?- three prior cesarean sections ? ?Past Medical History: ?Past Medical History:  ?Diagnosis Date  ? Anxiety   ? IV drug abuse (HCC)   ? Seizures (HCC) 2016  ? "from methadone withdrawal"  ? Substance abuse (HCC)   ? ? ?Past Surgical History: ?Past Surgical History:  ?Procedure Laterality Date  ? APPENDECTOMY    ? CESAREAN SECTION  07/2008; 11/2006  ? CESAREAN SECTION  02/03/2012  ? Procedure: CESAREAN SECTION;  Surgeon: Loney LaurenceMichelle A Horvath, MD;  Location: [facility] ORS;  Service: Gynecology;  Laterality: N/A;  ? I & D EXTREMITY Right 04/10/2021  ? Procedure: 1.  Irrigation and excisional debridement of right index finger open PIP dislocation;  Surgeon: Ernest Mallickreighton, James J III, MD;  Location: [facility] OR;  Service: Orthopedics;  Laterality: Right;  ? PERCUTANEOUS PINNING Right 04/10/2021  ? Procedure:  2.  Open reduction and pin fixation of PIP dislocation to the right index finger 3.  Repair of the ulnar collateral ligament of the PIP joint to the right index finger 4.  Neurolysis of the ulnar digital nerve to the right index finger;  Surgeon: Ernest Mallickreighton, James J III, MD;  [facility];  Service: Orthopedics;  Laterality: Right;  ? RADIOLOGY WITH ANESTHESIA N/A 05/22/2018  ? Procedure: MRI WITH ANESTHESIA;  Surgeon: Radiologist, Medication, MD;  Location: [facility] OR;  Service: Radiology;  Laterality: N/A;  ? TONSILLECTOMY    ? ? ?Obstetrical History: ?OB History   ? ? Gravida  ?4  ? Para  ?3  ? Term  ?3  ?  Preterm  ?   ? AB  ?   ? Living  ?3  ?  ? ? SAB  ?   ? IAB  ?   ? Ectopic  ?   ? Multiple  ?   ? Live Births  ?3  ?   ?  ?  ? ? ?Social History: ?Social History  ? ?Socioeconomic History  ? Marital status: Single  ?  Spouse name: Not on file  ? Number of children: Not on file  ? Years of education: Not on file  ? Highest education level: Not on file  ?Occupational History  ? Not on file  ?Tobacco Use  ? Smoking status: Every Day  ?  Packs/day: 1.00  ?  Years: 16.00  ?  Pack years: 16.00  ?  Types: Cigarettes  ? Smokeless tobacco: Never  ?Vaping Use  ? Vaping Use: Former  ?Substance and Sexual Activity  ? Alcohol use: Not Currently  ? Drug use: Yes  ?  Types: Heroin, "Crack" cocaine, Fentanyl  ?  Comment: Last used 01/08/22  ? Sexual activity: Yes  ?Other Topics Concern  ? Not on file  ?Social History Narrative  ? Not on file  ? ?Social Determinants of Health  ? ?Financial Resource Strain: Not on file  ?Food Insecurity: Not on file  ?Transportation Needs: Not on file  ?Physical Activity: Not on file  ?Stress: Not on file  ?  Social Connections: Not on file  ? ? ?Family History: ?No family history on file. ? ?Allergies: ?Allergies  ?Allergen Reactions  ? Hydrocodone Itching  ? Zofran [Ondansetron Hcl] Other (See Comments)  ?  "Blacked out for 6 hours"  ? ? ?Medications Prior to Admission  ?Medication Sig Dispense Refill Last Dose  ? buprenorphine-naloxone (SUBOXONE) 8-2 mg SUBL SL tablet Place 1 tablet under the tongue daily. 21 tablet 0   ? cloNIDine (CATAPRES) 0.2 MG tablet Take 1/2 tablets (0.1 mg total) by mouth 2 (two) times daily. 10 tablet 0   ? ferrous sulfate (FERROUSUL) 325 (65 FE) MG tablet Take 1 tablet (325 mg total) by mouth every other day. 30 tablet 3   ? loperamide (IMODIUM) 2 MG capsule Take 1 capsule (2 mg total) by mouth 4 (four) times daily as needed for diarrhea or loose stools. 12 capsule 0   ? naloxone (NARCAN) nasal spray 4 mg/0.1 mL For opioid overdose 2 each 0   ? terconazole (TERAZOL 7) 0.4 %  vaginal cream Place 1 applicator vaginally at bedtime. Use for seven days 45 g 0   ? ? ? ?Review of Systems  ?All systems reviewed and negative except as stated in HPI ? ?Physical Exam ?Last menstrual period 05/03/2021, unknown if currently breastfeeding. ?General appearance: alert, oriented, NAD ?Lungs: normal respiratory effort ?Heart: regular rate ?Abdomen: soft, non-tender; gravid ?Extremities: No calf swelling or tenderness ?FHR: 165 bpm ? ?Prenatal labs: ?ABO, Rh: --/--/O POS (02/03 1552) ?Antibody: NEG (02/03 1552) ?Rubella: 2.57 (02/03 1240) ?RPR: NON REACTIVE (02/03 1240)  ?HBsAg: NON REACTIVE (02/03 1240)  ?HIV: Non Reactive (02/03 1240)  ?GC/Chlamydia:  ?Neisseria Gonorrhea  ?Date Value Ref Range Status  ?01/08/2022 Negative  Final  ? ?Chlamydia  ?Date Value Ref Range Status  ?01/08/2022 Negative  Final  ?  ?GBS:    ?2-hr GTT: n/a ?Genetic screening:  none ?Anatomy US: limited but normal at 34 weeks ? ?Prenatal Transfer Tool  ?Maternal Diabetes: unknown ?Genetic Screening: not done ?Maternal Ultrasounds/Referrals: Normal ?Fetal Ultrasounds or other Referrals:  None ?Maternal Substance Abuse:  Yes:  Type: Other: opioids ?Significant Maternal Medications:   ?Significant Maternal Lab Results:  ? ?No results found for this or any previous visit (from the past 24 hour(s)). ? ?Patient Active Problem List  ? Diagnosis Date Noted  ? PROM (premature rupture of membranes) 02/22/2022  ? History of cesarean section 01/08/2022  ? Substance abuse affecting pregnancy in third trimester, antepartum 01/08/2022  ? Supervision of high risk pregnancy, antepartum 01/08/2022  ? Anemia affecting pregnancy 01/08/2022  ? Paraspinal abscess (HCC)   ? 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 Discitis of sacral region 05/22/2018  ? Heroin addiction (HCC) 10/09/2017  ? ? ?Assessment: ?Charlotte Mitchell is a 31 y.o. [phone] at [redacted]w[redacted]d here for scheduled CS. ? ?#RCS:  ?The risks of cesarean section discussed with the patient included but  were not limited to: bleeding which may require transfusion or reoperation; infection which may require antibiotics; injury to bowel, bladder, ureters or other surrounding organs; injury to the fetus; need for additional procedures including hysterectomy in the event of a life-threatening hemorrhage; placental abnormalities with subsequent pregnancies, incisional problems, thromboembolic phenomenon and other postoperative/anesthesia complications. The patient concurred with the proposed plan, giving informed written consent for the procedure. Patient has been NPO since last night she will remain NPO for procedure. Anesthesia and OR aware. Preoperative prophylactic antibiotics and SCDs ordered on call to the OR. To OR when ready.  ? ? ?#  Anesthesia: general ?#FWB: FHR 165 ?#GBS/ID: unknown ?#MOF: unkonwn ?#MOC: undecided ? ?Venora Maples ?02/22/2022, 2:07 PM ? ?

## 2022-02-22 NOTE — Anesthesia Postprocedure Evaluation (Signed)
Anesthesia Post Note ? ?Patient: Charlotte Mitchell ? ?Procedure(s) Performed: CESAREAN SECTION ? ?  ? ?Patient location during evaluation: PACU ?Anesthesia Type: General ?Level of consciousness: awake and alert ?Pain management: pain level controlled ?Vital Signs Assessment: post-procedure vital signs reviewed and stable ?Respiratory status: spontaneous breathing, nonlabored ventilation, respiratory function stable and patient connected to nasal cannula oxygen ?Cardiovascular status: blood pressure returned to baseline and stable ?Postop Assessment: no apparent nausea or vomiting ?Anesthetic complications: no ? ? ?No notable events documented. ? ?Last Vitals:  ?Vitals:  ? 02/22/22 1645 02/22/22 1700  ?BP:  (!) 151/98  ?Pulse:  61  ?Resp:    ?Temp: 36.8 ?C 36.7 ?C  ?SpO2:  100%  ?  ?Last Pain:  ?Vitals:  ? 02/22/22 1700  ?TempSrc: Oral  ?PainSc: 4   ? ?Pain Goal:   ? ?  ?  ?  ?  ?  ?  ?Epidural/Spinal Function Cutaneous sensation: Normal sensation (02/22/22 1700), Patient able to flex knees: Yes (02/22/22 1700), Patient able to lift hips off bed: Yes (02/22/22 1700), Back pain beyond tenderness at insertion site: No (02/22/22 1700), Progressively worsening motor and/or sensory loss: No (02/22/22 1700), Bowel and/or bladder incontinence post epidural: No (02/22/22 1700) ? ?Barnet Glasgow ? ? ? ? ?

## 2022-02-22 NOTE — Discharge Summary (Signed)
Postpartum Discharge Summary ?   ?Patient Name: Charlotte Mitchell ?DOB: [birth year] ?MRN: [id number] ? ?Date of admission: 02/22/2022 ?Delivery date:02/22/2022  ?Delivering provider: Clarnce Flock  ?Date of discharge: 02/24/2022 ? ?Admitting diagnosis: PROM (premature rupture of membranes) [O42.90] ?Intrauterine pregnancy: [redacted]w[redacted]d    ?Secondary diagnosis:  Principal Problem: ?  PROM (premature rupture of membranes) ?Active Problems: ?  Opioid use disorder, severe, dependence ([place] ?  Supervision of high risk pregnancy, antepartum ?  Status post repeat low transverse cesarean section ?  Active preterm labor ? ?Additional problems:  None    ?Discharge diagnosis: Term Pregnancy Delivered                                              ?Post partum procedures: None ?Augmentation: N/A ?Complications: None ? ?Hospital course: Onset of Labor With Unplanned C/S   ?31y.o. yo G4P3003 at 475w3das [facility]. Patient had a labor course significant for arriving to MAU fully dilated and 0 to +1 station and grossly ruptured with meconium. The patient went for cesarean section due to  history of cesarean x3 . Delivery details as follows: ?Membrane Rupture Time/Date: 11:00 AM ,02/22/2022   ?Delivery Method:C-Section, Low Transverse  ?Details of operation can be found in separate operative note.  ? ?OUD: Patient presented in active labor at advanced dilation and reported active opioid use. Was started on Methadone after cesarean section and uptitrated daily up to 4065mf methadone. Per patient she tried Methadone previously (and was up to a max dose of 180m39m the past). She had also tried Suboxone in the past but reports did not like it. She was being uptitrated but wanted to leave the hospital on 3/22. Medical team recommended she stay for further uptitration of Methadone and stabilization of her opioid use disorder but despite multiple conversations regarding concerns for her safety patient opted to leave  against advice of medical team. She was given an additional 20 mg of Methadone (to max of 60 mg on 3/22) as well as offered PRN Oxycodone. Patient ultimately still decided to leave hospital on 02/24/2022 We discussed in detail regarding possible complications including overdose or death in setting of further non-prescribed opioid use. Patient expressed understanding and left after signing AMA [place]. She and her family member was informed and reminded multiple times regarding her follow up appointment for her OUD and incision check on 3/28. ? ?Patient was also initially hypertensive (in setting of cocaine use) and was started on Lasix and Procardia. However on POD#2 she had an episode of hypotension in setting of decreased PO intake, getting her Lasix, and had episode of dizziness/and presyncope in setting of hypotension. Did not hit head. Symptoms and BP improved with PO hydration and her BP medication and Lasix were discontinued. She was ultimately discharged without those medications and return precautions for hypertension/PP pre-eclampsia symptoms were discussed in detail.  ? ?At discharge she was ambulating with assistance,tolerating a regular diet, passing flatus, and urinating well.   ? ?Newborn Data: ?Birth date:02/22/2022  ?Birth time:2:21 PM  ?Gender:Female  ?Living status:Living  ?Apgars:8 ,8  ?Weight:3280 g  ? ?Magnesium Sulfate received: No ?BMZ received: No ?Rhophylac:No ?MMR:N/A ?T-DaP: No ?Flu: No ?Transfusion:No ? ?Physical exam  ?Vitals:  ? 02/24/22 0835 02/24/22 0844 02/24/22 1032 02/24/22 1636  ?BP: 101/69 108/69 98/62   ?Pulse: (!)Marland Kitchen  49 (!) 59 (!) 55   ?Resp:   14 16  ?Temp:    98.1 ?F (36.7 ?C)  ?TempSrc:    Oral  ?SpO2: 99% 99% 99% 100%  ?Weight:      ? ?General: alert ?Lochia: appropriate ?Uterine Fundus: firm ?Incision: Dressing is clean, dry, and intact ?DVT Evaluation: No evidence of DVT seen on physical exam. ?Labs: ?Lab Results  ?Component Value Date  ? WBC 15.2 (H) 02/23/2022  ? HGB 9.4  (L) 02/23/2022  ? HCT 27.3 (L) 02/23/2022  ? MCV 94.1 02/23/2022  ? PLT 126 (L) 02/23/2022  ? ? ?  Latest Ref Rng & Units 02/22/2022  ?  3:17 PM  ?CMP  ?Glucose 70 - 99 mg/dL 111    ?BUN 6 - 20 mg/dL 6    ?Creatinine 0.44 - 1.00 mg/dL 0.71    ?Sodium 135 - 145 mmol/L 136    ?Potassium 3.5 - 5.1 mmol/L 3.5    ?Chloride 98 - 111 mmol/L 106    ?CO2 22 - 32 mmol/L 22    ?Calcium 8.9 - 10.3 mg/dL 7.7    ?Total Protein 6.5 - 8.1 g/dL 4.9    ?Total Bilirubin 0.3 - 1.2 mg/dL <0.1    ?Alkaline Phos 38 - 126 U/L 214    ?AST 15 - 41 U/L 14    ?ALT 0 - 44 U/L 8    ? ?Edinburgh Score: ?   ? View : No data to display.  ?  ?  ?  ? ? ? ?After visit meds:  ?Allergies as of 02/24/2022   ? ?   Reactions  ? Hydrocodone Itching  ? Zofran [ondansetron Hcl] Other (See Comments)  ? "Blacked out for 6 hours"  ? ?  ? ?  ?Medication List  ?  ? ?STOP taking these medications   ? ?Narcan 4 MG/0.1ML Liqd nasal spray kit ?Generic drug: naloxone ?  ?terconazole 0.4 % vaginal cream ?Commonly known as: TERAZOL 7 ?  ? ?  ? ?TAKE these medications   ? ?ferrous sulfate 325 (65 FE) MG tablet ?Take 1 tablet (325 mg total) by mouth every other day. ?Start taking on: February 26, 2022 ?  ?ibuprofen 600 MG tablet ?Commonly known as: ADVIL ?Take 1 tablet (600 mg total) by mouth every 6 (six) hours. ?Start taking on: February 25, 2022 ?  ?Oxycodone HCl 10 MG Tabs ?Take 1 tablet (10 mg total) by mouth every 6 (six) hours as needed for severe pain. ?  ? ?  ? ? ? ?Discharge home in stable condition ?Infant Feeding: Bottle ?Infant Disposition:NICU ?Discharge instruction: per After Visit Summary and Postpartum booklet. ?Activity: Advance as tolerated. Pelvic rest for 6 weeks.  ?Diet: routine diet ?Future Appointments: ?Future Appointments  ?Date Time Provider [place]  ?03/02/2022  9:55 AM Dione Plover Annice Needy, MD [facility]  ? ?Follow up Visit: ?Please schedule this patient for a In person postpartum visit in 1 week with the following provider:  Dr. Dione Plover  . ?Additional Postpartum F/U:Incision check 1 week and OUD follow up in 1 weeks   ?High risk pregnancy complicated by:  no prenatal care, opioid use disorder ?Delivery mode:  C-Section, Low Transverse  ?Anticipated Birth Control:  Unsure - needs discussion regarding this at [facility] appt ? ? ?Renard Matter, MD, MPH ?OB Fellow, Faculty Practice ? ? ? ?

## 2022-02-22 NOTE — Op Note (Signed)
Operative Note  ? ?Patient: Charlotte Mitchell ? ?Date of Procedure: 02/22/2022 ? ?Procedure: Repeat Low Transverse Cesarean  ? ?Indications: previous uterine incision: low transverse x3, advanced cervical dilation (10 cm), and rupture of membranes ? ?Pre-operative Diagnosis: Repeat Cesarean Section SROM.  ? ?Post-operative Diagnosis: Same ? ?TOLAC Candidate: No- large 4x5 cm uterine window on R side of lower uterine segment, any future pregnancies should be delivered by 37 weeks ? ?Surgeon: Juliann Mule) and Role: ?   * Dione Plover, Annice Needy, MD - Primary ?   Woodroe Mode, MD - Assisting ? ?Assistants: none ? ?An experienced assistant was required given the standard of surgical care given the complexity of the case.  This assistant was needed for exposure, dissection, suctioning, retraction, instrument exchange, assisting with delivery with administration of fundal pressure, and for overall help during the procedure.  ? ?Anesthesia: general ? ?Anesthesiologist: Barnet Glasgow, MD  ? ?Antibiotics: Cefazolin and Azithromycin ?  ?Estimated Blood Loss: 752 ml  ? ?Total IV Fluids: 650 ml ? ?Urine Output:  50 cc OF clear urine ? ?Specimens: placenta to pathology  ? ?Complications: no complications  ? ?Indications: ?Charlotte Mitchell is a 31 y.o. [phone] with an IUP [redacted]w[redacted]d presenting for unscheduled, emergent cesarean secondary to the indications listed above. Clinical course notable for presentation to MAU fully dilated and s/p rupture of membranes at unknown time. Patient also endorses active opioid use disorder with last use around 0900 today. ? ?The risks of cesarean section discussed with the patient included but were not limited to: bleeding which may require transfusion or reoperation; infection which may require antibiotics; injury to bowel, bladder, ureters or other surrounding organs; injury to the fetus; need for additional procedures including hysterectomy in the event of a life-threatening hemorrhage; placental  abnormalities with subsequent pregnancies, incisional problems, thromboembolic phenomenon and other postoperative/anesthesia complications. The patient concurred with the proposed plan, giving informed written consent for the procedure. Patient has been NPO since last night she will remain NPO for procedure. Anesthesia and OR aware. Preoperative prophylactic antibiotics and SCDs ordered on call to the OR.  ? ?Findings: Viable infant in cephalic presentation, no nuchal cord present. Apgars 8 , 8 , . Weight 3280 g . Heavy meconium stained amniotic fluid. Normal placenta, three vessel cord. Minimal adhesive disease on the anterior abdominal wall but marked adhesive disease of the anterior uterine wall with numerous adhesions of the bladder. In addition there was a large 4x5 cm uterine window towards the R side of the lower uterine segment through which meconium stained amniotic fluid could be appreciated with no overlying uterine tissue whatsoever. Normal bilateral fallopian tubes, Normal bilateral ovaries. ? ?Procedure Details: A Time Out was held and the above information confirmed. The patient received intravenous antibiotics and had sequential compression devices applied to her lower extremities preoperatively. The patient was taken back to the operative suite where general anesthesia was administered due to urgent nature of the procedure and lack of any available labs at that time. After induction of anesthesia, the patient was draped and prepped with betadine splash and placed in a dorsal supine position with a leftward tilt. A low transverse skin incision was made with scalpel and carried down through the subcutaneous tissue to the fascia. Fascial incision was made and extended transversely bluntly. The fascia was separated from the underlying rectus tissue superiorly and inferiorly. The rectus muscles were separated in the midline bluntly and the peritoneum was entered bluntly with relative ease. An [employer]  retractor was placed to aid in visualization of the uterus, at which point significant adhesions of the anterior uterine wall to the bladder were noted as well as a large uterine window in the R portion of the lower uterine segment. Fetal head as well as meconium stained fluid was appreciated through the window. There was no appreciable overlying uterine tissue covering the window. The utero-vesical peritoneal reflection was incised transversely and the bladder flap was bluntly freed from the lower uterine segment. An alice clamp was then used to open the uterine window and rupture membranes. The infant was successfully delivered from cephalic presentation, the umbilical cord was clamped after 1 minute. Cord ph was sent, and cord blood was obtained for evaluation. The placenta was removed Intact and appeared normal. There was a large amount of terminal meconium. The uterine incision was closed with running locked sutures of 0-Monocryl in a single layer. Given the profound adhesions the closed hysterotomy was closely inspected and it was felt the bladder was intact and had not been injured. In addition urine was confirmed to be clear at that time. Due to some mild oozing a layer of Arista was placed. Overall, excellent hemostasis was noted. The abdomen and the pelvis were cleared of all clot and debris and the Ubaldo Glassing was removed. Hemostasis was confirmed on all surfaces.  The peritoneum was was not reapproximated. The fascia was then closed using 0 Vicryl in a running fashion. The subcutaneous layer was reapproximated with plain gut and the skin was closed with a 4-0 vicryl subcuticular stitch. The patient tolerated the procedure well. Sponge, lap, instrument and needle counts were correct x 2. She was taken to the recovery room in stable condition. ? ?Disposition: PACU - hemodynamically stable.  ? ? ?Signed: ?Clarnce Flock, MD/MPH ?Attending Family Medicine Physician, Faculty Practice ?[facility] [place] ?

## 2022-02-22 NOTE — Progress Notes (Signed)
Discussed elevated blood pressures with RN Cleda Daub as well as HA. Review of postpartum Bps shows multiple Bps in the 150s/90s-100s. Due to patient's limited prenatal care there is no pre-existing diagnosis of Pre-eclampsia or gestational hypertension. Discussed blood pressures and HA with Dr. Elonda Husky, will start Lasix 20mg  BID and Procardia 30mg  daily now. Patient has a history of opioid use disorder and has PRN dilaudid along with NSAIDs for pain and PRN methadone for withdrawal. Per Dr. Elonda Husky will not initiate mag at this time and will monitor the headache. ? ?Charlotte Nones, MD ?FM PGY-1 ? ?

## 2022-02-22 NOTE — MAU Note (Signed)
Pt arrived by EMS.  3 prior c/s. Srom around 1100. No prenatal care.  Pt contracting. Hx of Heroin use-= last at 0900.  ?

## 2022-02-22 NOTE — Progress Notes (Signed)
Attempted to call MD regarding patient's blood pressure but was unable to get a hold of MD. I sent a secure chat asking to call me back. I will attempt to call again in a few minutes ? ?

## 2022-02-22 NOTE — Anesthesia Preprocedure Evaluation (Signed)
Anesthesia Evaluation  ?Patient identified by MRN, date of birth, ID band ?Patient awake ? ?Preop documentation limited or incomplete due to emergent nature of procedure. ? ?Airway ?Mallampati: II ? ?TM Distance: >3 FB ?Neck ROM: Full ? ?Mouth opening: Limited Mouth Opening ? Dental ?no notable dental hx. ?(+) Missing, Loose, Poor Dentition ?  ?Pulmonary ?Current Smoker,  ?  ?Pulmonary exam normal ?breath sounds clear to auscultation ? ? ? ? ? ? Cardiovascular ?Normal cardiovascular exam ?Rhythm:Regular Rate:Normal ? ? ?  ?Neuro/Psych ?Seizures -,  Anxiety   ? GI/Hepatic ?(+)  ?  ? substance abuse ? , This am ?  ?Endo/Other  ? ? Renal/GU ?  ? ?  ?Musculoskeletal ? ?(+) narcotic dependent ? Abdominal ?  ?Peds ? Hematology ?  ?Anesthesia Other Findings ?All zofran ? Reproductive/Obstetrics ?(+) Pregnancy ? ?  ? ? ? ? ? ? ? ? ? ? ? ? ? ?  ?  ? ? ? ? ? ? ? ? ?Anesthesia Physical ?Anesthesia Plan ? ?ASA: 3 and emergent ? ?Anesthesia Plan: General  ? ?Post-op Pain Management:   ? ?Induction: Intravenous, Cricoid pressure planned and Rapid sequence ? ?PONV Risk Score and Plan: Treatment may vary due to age or medical condition ? ?Airway Management Planned:  ? ?Additional Equipment:  ? ?Intra-op Plan:  ? ?Post-operative Plan: Extubation in OR ? ?Informed Consent: I have reviewed the patients History and Physical, chart, labs and discussed the procedure including the risks, benefits and alternatives for the proposed anesthesia with the patient or authorized representative who has indicated his/her understanding and acceptance.  ? ? ? ?Dental advisory given ? ?Plan Discussed with: Surgeon, Anesthesiologist and CRNA ? ?Anesthesia Plan Comments: (Repeat c/s x 4 hx of Substance abuse admits to Opiates this AM and Difficult IV access arrives complete in labor directly to OR)  ? ? ? ? ? ? ?Anesthesia Quick Evaluation ? ?

## 2022-02-23 DIAGNOSIS — R030 Elevated blood-pressure reading, without diagnosis of hypertension: Secondary | ICD-10-CM | POA: Diagnosis not present

## 2022-02-23 DIAGNOSIS — O99324 Drug use complicating childbirth: Secondary | ICD-10-CM | POA: Diagnosis not present

## 2022-02-23 DIAGNOSIS — O34211 Maternal care for low transverse scar from previous cesarean delivery: Secondary | ICD-10-CM | POA: Diagnosis not present

## 2022-02-23 DIAGNOSIS — O99334 Smoking (tobacco) complicating childbirth: Secondary | ICD-10-CM | POA: Diagnosis not present

## 2022-02-23 DIAGNOSIS — O4292 Full-term premature rupture of membranes, unspecified as to length of time between rupture and onset of labor: Secondary | ICD-10-CM | POA: Diagnosis not present

## 2022-02-23 DIAGNOSIS — O480 Post-term pregnancy: Secondary | ICD-10-CM | POA: Diagnosis not present

## 2022-02-23 DIAGNOSIS — D62 Acute posthemorrhagic anemia: Secondary | ICD-10-CM | POA: Diagnosis not present

## 2022-02-23 DIAGNOSIS — F17210 Nicotine dependence, cigarettes, uncomplicated: Secondary | ICD-10-CM | POA: Diagnosis not present

## 2022-02-23 DIAGNOSIS — O26893 Other specified pregnancy related conditions, third trimester: Secondary | ICD-10-CM | POA: Diagnosis not present

## 2022-02-23 LAB — CBC
HCT: 27.3 % — ABNORMAL LOW (ref 36.0–46.0)
Hemoglobin: 9.4 g/dL — ABNORMAL LOW (ref 12.0–15.0)
MCH: 32.4 pg (ref 26.0–34.0)
MCHC: 34.4 g/dL (ref 30.0–36.0)
MCV: 94.1 fL (ref 80.0–100.0)
Platelets: 126 10*3/uL — ABNORMAL LOW (ref 150–400)
RBC: 2.9 MIL/uL — ABNORMAL LOW (ref 3.87–5.11)
RDW: 12.6 % (ref 11.5–15.5)
WBC: 15.2 10*3/uL — ABNORMAL HIGH (ref 4.0–10.5)
nRBC: 0 % (ref 0.0–0.2)

## 2022-02-23 LAB — RPR: RPR Ser Ql: NONREACTIVE

## 2022-02-23 LAB — HCV RNA QUANT: HCV Quantitative: NOT DETECTED IU/mL (ref >50)

## 2022-02-23 MED ORDER — HYDROXYZINE HCL 25 MG PO TABS
25.0000 mg | ORAL_TABLET | Freq: Every evening | ORAL | Status: DC | PRN
  Administered 2022-02-23: 25 mg via ORAL
  Filled 2022-02-23: qty 1

## 2022-02-23 MED ORDER — METHADONE HCL 10 MG/ML PO CONC
40.0000 mg | Freq: Every day | ORAL | Status: DC
  Administered 2022-02-23 – 2022-02-24 (×2): 40 mg via ORAL
  Filled 2022-02-23 (×2): qty 5

## 2022-02-23 MED ORDER — METHADONE HCL 10 MG/ML PO CONC
10.0000 mg | ORAL | Status: AC | PRN
  Filled 2022-02-23: qty 5

## 2022-02-23 MED ORDER — METHADONE HCL 10 MG/ML PO CONC
10.0000 mg | Freq: Once | ORAL | Status: AC
  Administered 2022-02-23: 10 mg via ORAL

## 2022-02-23 MED ORDER — OXYCODONE HCL 5 MG PO TABS
10.0000 mg | ORAL_TABLET | ORAL | Status: DC | PRN
  Administered 2022-02-23 – 2022-02-24 (×3): 10 mg via ORAL
  Filled 2022-02-23 (×3): qty 2

## 2022-02-23 NOTE — Progress Notes (Addendum)
CSW acknowledged consult and attempted to meet with MOB; however, MOB reported that she was about to get some rest and requested that CSW return later. CSW asked if it would be okay for CSW to return in one hour, MOB reported yes, CSW agreed to return. ? ?CSW followed up with MOB. MOB requested that CSW return tomorrow, CSW agreed.  ? ?Celso Sickle, LCSW ?Clinical Social Worker ?[facility] ?Cell#: [phone] ? ?

## 2022-02-23 NOTE — Progress Notes (Signed)
At midnight I gave pt procardia 30mg  and lasix 20 mg. Checked bp it was 173/94 with worsening headache. I told her I would give it one hour from when I gave her the BP meds and recheck in 1 hour to notify MD. At 0100 Pt Bp was 163/82 and pt stated there was a little bit of improvement in her HA. I informed MD and said he would call me back (0105) ? ?15- MD called me back and said he talked to attending who said to just watch her BP's throughout the night and that he did not want to start mag at this time. Pt is withdrawing and metadone orders are D/C'd. Got orders for that to restart, and pt is asking for sleep medicine. MD said he will see if there something he can give for that.  ?

## 2022-02-23 NOTE — Progress Notes (Signed)
Subjective: ?Postpartum Day 1: Cesarean Delivery ?Patient reports incisional pain and tolerating PO. \Having some withdrawal symptoms. Tolerating methadone well, but "not enough" ? ?Objective: ?Vital signs in last 24 hours: ?Temp:  [95.9 ?F (35.5 ?C)-98.4 ?F (36.9 ?C)] 98.2 ?F (36.8 ?C) (03/21 9357) ?Pulse Rate:  [50-93] 50 (03/21 0814) ?Resp:  [11-20] 16 (03/21 0177) ?BP: (113-173)/(75-130) 120/75 (03/21 0814) ?SpO2:  [98 %-100 %] 100 % (03/21 0814) ?Weight:  [77.1 kg] 77.1 kg (03/20 1819) ? ?Physical Exam:  ?General: alert, cooperative, and no distress ?Lochia: appropriate ?Uterine Fundus: firm ?Incision: dressing still intact ?DVT Evaluation: No evidence of DVT seen on physical exam. ?Negative Homan's sign. ? ?Recent Labs  ?  02/22/22 ?1517 02/23/22 ?0723  ?HGB 10.9* 9.4*  ?HCT 33.4* 27.3*  ? ? ?Assessment/Plan: ?POD#1 C/s ?Continue current care ?Change pain control to oxycodone 10mg  q4hrs. ?OUD ?Increase methadone to 40mg  qd. ?Increase by 5mg  qd ?Used fentanyl and heroin outpt.  ?Has been in MAT before - both methadone and suboxone. Feels like she did better on methadone. ?Discussed her finding methadone clinic outside of hospital - she will have to initiate treatment there ?S/W consult - will need DHS referral ?Anemia ?Oral iron ?Elevated BP ?? Due to withdrawal symptoms ?Stable currently. ? ? ? ? ?02/23/2022, 9:36 AM ? ? ?

## 2022-02-23 NOTE — Progress Notes (Signed)
Call by RN regarding Bps and headache. Headache has improved.  BP after lasix and procardia were administered was initially 163/86 and per Dr. Inda Castle was not initiated at that time. BP improved to 113/94 2 hours after lasix and procardia were administered. PRN Methadone order was extended to 8AM given concerns for opioid withdrawal. Patient requested sleeping medicine but will try benadryl first.  ?

## 2022-02-23 NOTE — Lactation Note (Signed)
This note was copied from a baby's chart. ?Lactation Consultation Note ? ?Patient Name: Charlotte Mitchell ?Today's Date: 02/23/2022 ?  ?Age:33 hours ? ?Spoke to [employer] and she reported to [facility] that mom won't be breastfeeding, just doing 100% formula; there is Hx of polysubstance use and a UDS (+) for cocaine. Baby currently on Similac 20 calorie formula. Lactation services are completed. ? ?Baraa Tubbs S Racer Quam ?02/23/2022, 9:19 AM ? ? ? ?

## 2022-02-24 DIAGNOSIS — Z98891 History of uterine scar from previous surgery: Secondary | ICD-10-CM

## 2022-02-24 LAB — SURGICAL PATHOLOGY

## 2022-02-24 MED ORDER — OXYCODONE HCL 10 MG PO TABS: 10.0000 mg | ORAL_TABLET | Freq: Four times a day (QID) | ORAL | 0 refills | Status: DC | PRN

## 2022-02-24 MED ORDER — FERROUS SULFATE 325 (65 FE) MG PO TABS: 325.0000 mg | ORAL_TABLET | ORAL | 0 refills | Status: DC

## 2022-02-24 MED ORDER — METHADONE HCL 10 MG/ML PO CONC
20.0000 mg | Freq: Once | ORAL | Status: AC
  Administered 2022-02-24: 20 mg via ORAL
  Filled 2022-02-24: qty 5

## 2022-02-24 MED ORDER — FERROUS SULFATE 325 (65 FE) MG PO TABS
325.0000 mg | ORAL_TABLET | ORAL | Status: DC
Start: 2022-02-24 — End: 2022-02-25
  Administered 2022-02-24: 325 mg via ORAL
  Filled 2022-02-24: qty 1

## 2022-02-24 MED ORDER — IBUPROFEN 600 MG PO TABS: 600.0000 mg | ORAL_TABLET | Freq: Four times a day (QID) | ORAL | 0 refills | Status: DC

## 2022-02-24 NOTE — Progress Notes (Signed)
CSW met with MOB at bedside and attempted to complete psychosocial assessment; however, MOB was asleep. CSW will return at a later time.  ? ?Cadin Luka, LCSW ?Clinical Social Worker ?[facility] ?Cell#: [phone] ?

## 2022-02-24 NOTE — Progress Notes (Signed)
CSW attempted to meet with MOB again; however, MOB was still asleep and did not arouse when CSW called her name.  ? ?CSW will check back in later today.  ? ?CSW contacted [facility] CPS and made a CPS report due to infant's positive UDS for amphetamines.  ? ?Celso Sickle, LCSW ?Clinical Social Worker ?[facility] ?Cell#: [phone] ? ?

## 2022-02-24 NOTE — Progress Notes (Addendum)
GME ATTESTATION:  ?I saw and evaluated the patient. I agree with the findings and the plan of care as documented in the resident?s note. ? ?Patient is POD#2 after rLTCS (STAT) for advance dilation in setting of three prior c-sections. Patient received Lasix and Procardia for elevated BP but now low normal and had episode of dizziness/and presyncope in setting of hypotension. Did not hit head. Patient did not have IV and hard stick. Team attempted to get IV placed but patient reported "she's fine" and stated she did not need IV ? ?Assessed her this afternoon. Resting in bed. Reports she feels better and not dizzy. ? ?Would like to have baby circumcised when medically ready but wants to talk about consent at later time. Baby currently in NICU. ? ?- Will monitor BP ?- If continues to be soft will plan to call IV team for help with placing IV and give IV fluids ?- If gets IV can consider IV venofer as well since HgB 9.4 ? ?Warner Mccreedy, MD, MPH ?OB Fellow, Faculty Practice ?[place] ?02/24/2022 1:56 PM ? ? ? ?POSTPARTUM PROGRESS NOTE ? ?POD #2 ? ?Subjective: ? ?Charlotte Mitchell is a 31 y.o. [phone] s/p rLTCS at [redacted]w[redacted]d.  She reports she doing well. No acute events overnight. She reports she is doing well. She denies any problems with ambulating, voiding or po intake. Denies nausea or vomiting. She has passed flatus. Pain is well controlled.  Lochia is appropriate. She feels more controlled on this methadone dose. Later in the morning received call from nursing about fall in hallway when patient was walking and stumbled in hallway. BP at that time was 64/37 and nursing called rapid response. She was brought back to room and when laying down BP returned to 108/69 and patient was alert and oriented when I was in the room.  ? ?Objective: ?Blood pressure 98/71, pulse 75, temperature 98.2 ?F (36.8 ?C), temperature source Oral, resp. rate 16, weight 77.1 kg, last menstrual period 05/03/2021, SpO2 100  %, unknown if currently breastfeeding. ? ?Physical Exam:  ?General: alert, cooperative and no distress ?Chest: no respiratory distress ?Heart:regular rate, distal pulses intact ?Abdomen: soft, nontender ?Uterine Fundus: firm, appropriately tender ?DVT Evaluation: No calf swelling or tenderness ?Extremities: no LE edema ?Skin: warm, dry; incision clean/dry/intact w/ pressure dressing in place ? ?Recent Labs  ?  02/22/22 ?1517 02/23/22 ?0723  ?HGB 10.9* 9.4*  ?HCT 33.4* 27.3*  ? ? ?Assessment/Plan: ?Charlotte Mitchell is a 31 y.o. [phone] s/p rLTCS at [redacted]w[redacted]d. ? ?POD#2 - Doing welll; pain controlled ? Routine postpartum care ? OOB, ambulated ? Lovenox for VTE prophylaxis ?Anemia: asymptomatic ? po ferrous sulfate  ?Contraception: unsure currrently ?Feeding: bottle ? ?Dispo: Plan for discharge tomorrow. Baby in NICU ? ?#OUD ?Used fentanyl and heroin outpatient ?-Methadone at 40 mg qd tolerating well continue ?-will need to initiate MAT clinic outpatient ?-SW to see patient today ? ?#Elevated BP > Hypotension ?Was hypotensive to 64/37 this morning and had a mechanical fall in hallway. Denies any chest pain or shortness of breath. BP improved when laying down. Elevated BP yesterday could have been from hx of cocaine use.  ?-D/c lasix and procardia ?-monitor BP closely ? ? ? LOS: 2 days  ? ?Levin Erp MD ?FM PGY-1 ?02/24/2022, 7:35 AM  ?

## 2022-02-24 NOTE — Progress Notes (Signed)
Delayed documentation ? ?Received call from RN that patient would like to leave/discharge today. ? ?Was started on Methadone after cesarean section and uptitrated daily up to 40mg  of methadone. Per patient she tried Methadone previously (and was up to a max dose of 180mg  in the past). She had also tried Suboxone in the past but reports did not like it. She was being uptitrated but wanted to leave the hospital on 3/22. Medical team recommended she stay for further uptitration of Methadone and stabilization of her opioid use disorder but despite multiple conversations regarding concerns for her safety patient opted to leave against advice of medical team. She was given an additional 20 mg of Methadone (to max of 60 mg on 3/22) as well as offered PRN Oxycodone. Patient ultimately still decided to leave hospital on 02/24/2022 We discussed in detail regarding possible complications including overdose or death in setting of further non-prescribed opioid use. Patient expressed understanding and left after signing [place] paperwork. She and her family member was informed and reminded multiple times regarding her follow up appointment for her OUD and incision check on 3/28. ? ?Renard Matter, MD, MPH ?OB Fellow, Faculty Practice ? ?

## 2022-02-24 NOTE — Progress Notes (Signed)
Pt left AMA, paper was signed and witnessed.  Dr. Warner Mccreedy at bedside.  Information was given about 6 week F/U appointment to patient who verbalized understanding.   ? ?Patient very irritable, her father was at bedside and he was her ride home.  ? ?Charge nurse aware.  ?

## 2022-02-24 NOTE — Progress Notes (Addendum)
Notified by Secretary that patient was in the hallway, confused and unstable gait. RN went quickly to help patient and she fell in the hallway, was confused and pale. Called for additional help to help her off the floor into a wheelchair and back to her room to supine position in bed. Vital taken: BP 64/37, 75 pulse, 100% SpO2. Rapid Response called.  ?When RR arrived, patient was more responsive and oriented, BP improved to 101/69.  ?Dr. Pennie Banter at bedside. IV attempted in foot by RR-RN but not successful. BP 108/69, 99% SpO2. Patient responding, more oriented and says "I'm fine". Dr. Pennie Banter assessed patient to be better, no new orders at current moment.  ?She denied taking any drugs when asked. She denied hitting her head or injury. ?

## 2022-02-24 NOTE — Clinical Social Work Maternal (Signed)
?CLINICAL SOCIAL WORK MATERNAL/CHILD NOTE ? ?Patient Details  ?Name: Charlotte Mitchell ?MRN: [id number] ?Date of Birth: 1991-01-14 ? ?Date:  02/24/2022 ? ?Clinical Social Worker Initiating Note:  Abundio Miu, [place] Date/Time: Initiated:  02/24/22/1453    ? ?Child's Name:  Charlotte Mitchell  ? ?Biological Parents:  Mother, Father (Father: Charlotte Mitchell 75/1/? (uncertain of the year))  ? ?Need for Interpreter:  None  ? ?Reason for Referral:  Late or No Prenatal Care  , Current Substance Use/Substance Use During Pregnancy  , Other (Comment) (Infant's NICU admission)  ? ?Address:  [address] ?[place] [address]  ?  ?Phone number:  [phone] (home)    ? ?Additional phone number:  ? ?Household Members/Support Persons (HM/SP):   Household Member/Support Person 1 ? ? ?HM/SP Name Relationship DOB or Age  ?HM/SP -1   friend    ?HM/SP -2        ?HM/SP -3        ?HM/SP -4        ?HM/SP -5        ?HM/SP -6        ?HM/SP -7        ?HM/SP -8        ? ? ?Natural Supports (not living in the home):     ? ?Professional Supports: None  ? ?Employment: Unemployed  ? ?Type of Work:    ? ?Education:  Other (comment) (MOB reported that she didn't know her highest level of education completed)  ? ?Homebound arranged:   ? ?Financial Resources:  Other (Comment) (Medicaid Pending)  ? ?Other Resources:     ? ?Cultural/Religious Considerations Which May Impact Care:   ? ?Strengths:     ? ?Psychotropic Medications:        ? ?Pediatrician:      ? ?Pediatrician List:  ? ?[facility]    ?[place]    ?[facility]    ?[facility]    ?[facility]    ?[facility]    ? ? ?Pediatrician Fax Number:   ? ?Risk Factors/Current Problems:  Substance Use  , Basic Needs    ? ?Cognitive State:  Goal Oriented  , Alert    ? ?Mood/Affect:  Agitated  , Irritable  , Apprehensive    ? ?CSW Assessment: CSW met with MOB at bedside to complete psychosocial assessment. MOB was laying down in bed but awake. CSW introduced self and explained role. MOB  answered some questions but seemed agitated by CSW's line of questioning. CSW asked if MOB wanted to participate in assessment, MOB reported yes. MOB reported that she resides with a friend. MOB reported that she does not have any items to care for infant. CSW asked if MOB would be able to get items to care for infant, MOB replied "I don't know". CSW inquired about MOB's support system, MOB replied "I don't know". CSW inquired about MOB's older children, MOB reported that they reside with her father. CSW asked for their names and birth dates, MOB shared Charlotte Mitchell 12/29/"can't think of the year right now"( Charlotte Mitchell did not provide his DOB) Charlotte Mitchell per chart review her DOB is [birth year] and her last name is Parke Simmers).  ? ?CSW inquired about MOB's mental health history, MOB denied any mental health history. MOB denied any history of postpartum depression. CSW inquired about how MOB was feeling emotionally since giving birth, MOB replied "I don't know". MOB presented irritable and agitated.  ? ?CSW and MOB discussed infant's  NICU admission. CSW informed MOB about the NICU. MOB reported that she guesses she feels informed. MOB denied any transportation barriers with visiting infant in the NICU. MOB denied any questions/concerns regarding the NICU.  ? ?CSW informed MOB about the hospital drug screen policy due to MOB having no prenatal care and documented substance use during pregnancy. CSW asked if MOB had any barriers to receiving prenatal care, MOB replied "I don't know". CSW asked if MOB would have any barriers to getting infant to the pediatrician, MOB replied "I don't know". CSW inquired about MOB's substance use, MOB initially replied "I don't know" then reported using fentanyl for a couple of years. MOB reported that she used amphetamines recently but could not recall when. CSW informed MOB that infant's UDS was positive for amphetamines which means a CPS report would be made. CSW informed  MOB that CPS would follow up with MOB regarding CPS report, MOB reported okay. CSW asked how the Charlotte Mitchell was working for [employer], MOB reported that "it's okay not that great" and shared that she is ready to go home. CSW asked why MOB was ready to go home MOB reported that she was just laying in bed and not doing anything. CSW asked if MOB was interested in continuing Charlotte Mitchell at discharge and interested in substance use treatment resources, MOB replied "sure". CSW provided MOB with resources. CSW asked if MOB would have any issues following up with treatment after discharge, MOB reported that this is frustrating and she doesn't feel like doing this. CSW asked if MOB would be able to follow up with the resources for Charlotte Mitchell treatment on her own, MOB replied "sure".  ? ?CSW asked if MOB was familiar with SIDS, MOB reported yes. CSW asked what MOB knew about SIDS, MOB reported that she doesn't want to talk right now, CSW agreed to leave.  ? ?RN notified CSW that MOB is requesting to leave.  ? ?CSW followed up with MOB at bedside to ensure that she has all needed resources before leaving the hospital.  ? ?CSW returned to MOB's room and notified MOB that CSW was updated that MOB wanted to leave and CSW wanted to ensure that MOB had needed resources before leaving, MOB agreeable to speaking to [place]. CSW asked if MOB had WIC and food stamps. MOB reported no and that she is interested in both resources. CSW agreed to make [facility] referral, MOB agreeable. CSW agreed to send MOB the link to apply for food stamps. CSW asked if MOB needed assistance setting up Charlotte Mitchell treatment for post discharge, MOB reported yes. CSW asked if MOB could assist MOB in calling facilities, MOB declined. CSW agreed to follow up with MOB tomorrow if she remains inpatient and would assist if MOB was interested, MOB shook her head up and down in a yes motion. CSW asked if MOB could provide PMADs education, MOB reported that she was not interested in  PMADs education. CSW assessed for safety, MOB denied SI, HI, and domestic violence. CSW asked if there was anything else that CSW could do to be helpful, MOB reported no additional needs. CSW provided MOB with the phone number to the NICU so MOB can call for updates if needed. CSW provided MOB with CSW contact information and encouraged MOB to call CSW if any needs/concerns arise. MOB thanked CSW.  ? ?CSW completed [facility] referral. ?CSW sent MOB the link to apply for food stamps.  ? ?[facility] CPS report made earlier today, barriers to  infant's discharge.  ? ?CSW Plan/Description:  Psychosocial Support and Ongoing Assessment of Needs, Other Patient/Family Education, [place], Child Protective Service Report  , CSW Awaiting CPS Disposition Plan, CSW Will Continue to Monitor Umbilical Cord Tissue Drug Screen Results and Make Report if Warranted  ? ? ?Burnis Medin, LCSW ?02/24/2022, 3:02 PM ? ?

## 2022-03-02 ENCOUNTER — Ambulatory Visit: Payer: Self-pay | Admitting: Family Medicine

## 2022-03-04 ENCOUNTER — Telehealth (HOSPITAL_COMMUNITY): Payer: Self-pay | Admitting: *Deleted

## 2022-03-04 NOTE — Telephone Encounter (Signed)
Attempted Hospital Discharge Follow-Up Call.  Left voice mail requesting that patient return RN's phone call.  

## 2022-03-06 DIAGNOSIS — Z419 Encounter for procedure for purposes other than remedying health state, unspecified: Secondary | ICD-10-CM | POA: Diagnosis not present

## 2022-04-05 DIAGNOSIS — Z419 Encounter for procedure for purposes other than remedying health state, unspecified: Secondary | ICD-10-CM | POA: Diagnosis not present

## 2022-05-06 DIAGNOSIS — Z419 Encounter for procedure for purposes other than remedying health state, unspecified: Secondary | ICD-10-CM | POA: Diagnosis not present

## 2022-05-20 IMAGING — DX DG FINGER INDEX 2+V*R*
1 series · 3 of 3 positions shown · non-contrast
Comparison: Films from earlier in the same day.

CLINICAL DATA: Status post reduction

EXAM:
RIGHT INDEX FINGER 2+V

[Series 1: finger · 0.14mm/px · 3 of 3 slices shown]
[im 1/3]
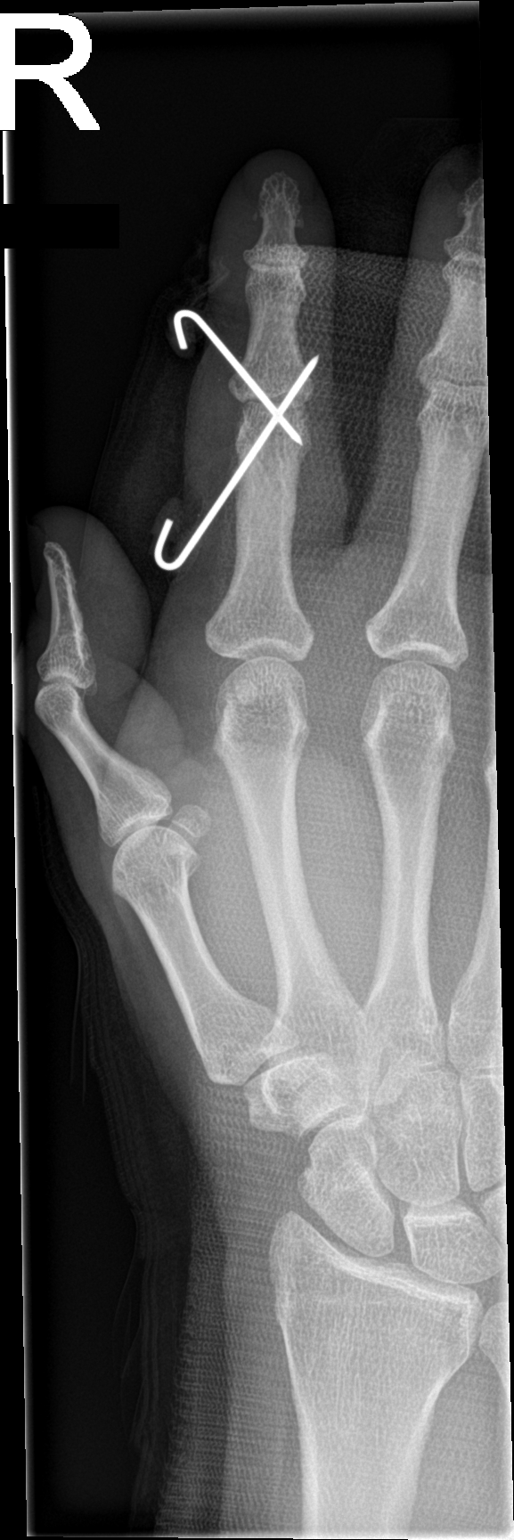
[im 2/3]
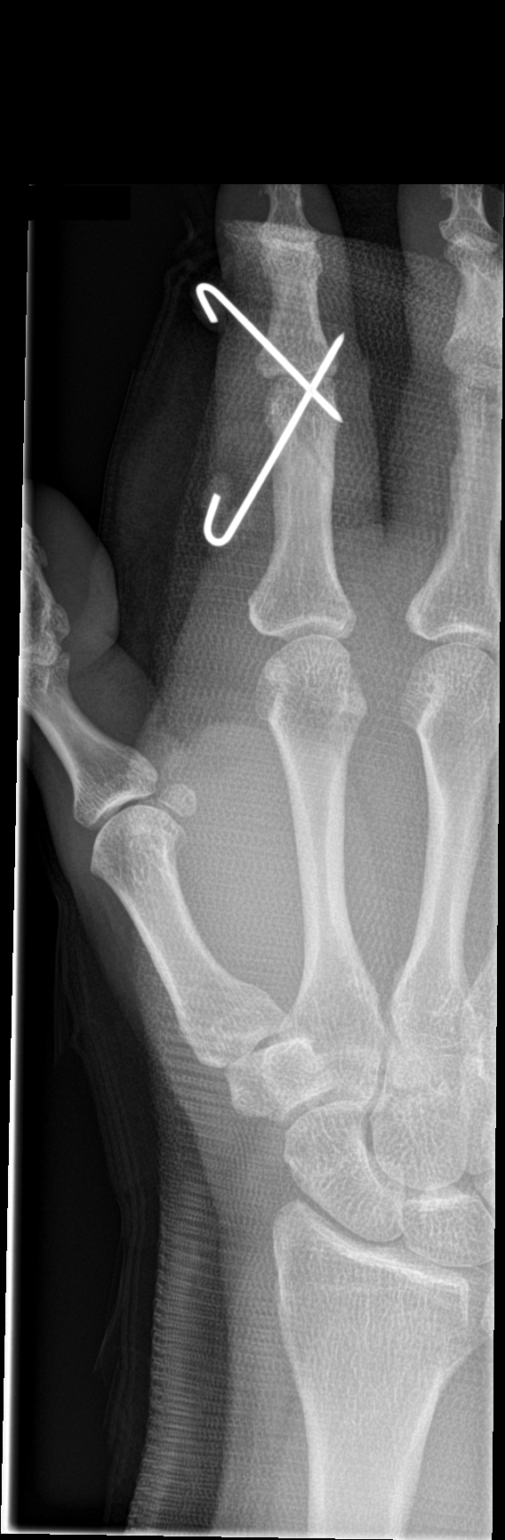
[im 3/3]
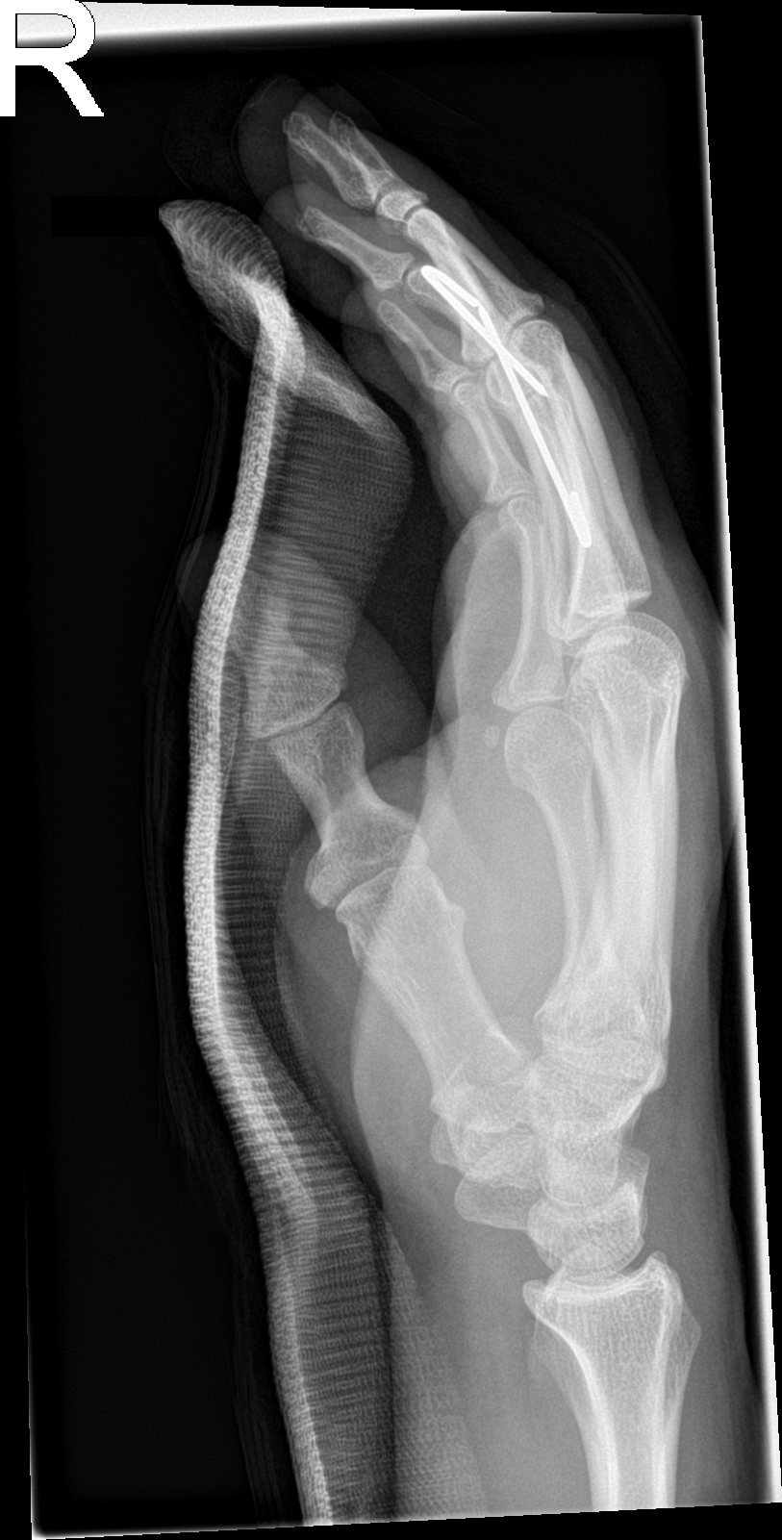

[3 of 3 positions shown; findings below may reference images not displayed]

FINDINGS: Splinting material is now seen. Two fixation wires are noted
traversing the second PIP joint. The joint has been reduced
appropriately.
IMPRESSION: Status post reduction with placement of 2 fixation wires across the
second PIP joint.

## 2022-06-05 DIAGNOSIS — Z419 Encounter for procedure for purposes other than remedying health state, unspecified: Secondary | ICD-10-CM | POA: Diagnosis not present

## 2022-07-06 DIAGNOSIS — Z419 Encounter for procedure for purposes other than remedying health state, unspecified: Secondary | ICD-10-CM | POA: Diagnosis not present

## 2022-08-06 DIAGNOSIS — Z419 Encounter for procedure for purposes other than remedying health state, unspecified: Secondary | ICD-10-CM | POA: Diagnosis not present

## 2022-09-05 DIAGNOSIS — Z419 Encounter for procedure for purposes other than remedying health state, unspecified: Secondary | ICD-10-CM | POA: Diagnosis not present

## 2022-10-06 DIAGNOSIS — Z419 Encounter for procedure for purposes other than remedying health state, unspecified: Secondary | ICD-10-CM | POA: Diagnosis not present

## 2022-10-22 DIAGNOSIS — E162 Hypoglycemia, unspecified: Secondary | ICD-10-CM | POA: Diagnosis not present

## 2022-10-22 DIAGNOSIS — I499 Cardiac arrhythmia, unspecified: Secondary | ICD-10-CM | POA: Diagnosis not present

## 2022-10-22 DIAGNOSIS — T40411A Poisoning by fentanyl or fentanyl analogs, accidental (unintentional), initial encounter: Secondary | ICD-10-CM | POA: Diagnosis not present

## 2022-10-22 DIAGNOSIS — F1110 Opioid abuse, uncomplicated: Secondary | ICD-10-CM | POA: Diagnosis not present

## 2022-10-22 DIAGNOSIS — N179 Acute kidney failure, unspecified: Secondary | ICD-10-CM | POA: Diagnosis not present

## 2022-10-22 DIAGNOSIS — Z7401 Bed confinement status: Secondary | ICD-10-CM | POA: Diagnosis not present

## 2022-10-22 DIAGNOSIS — T50904A Poisoning by unspecified drugs, medicaments and biological substances, undetermined, initial encounter: Secondary | ICD-10-CM | POA: Diagnosis not present

## 2022-10-22 DIAGNOSIS — Z743 Need for continuous supervision: Secondary | ICD-10-CM | POA: Diagnosis not present

## 2022-10-22 DIAGNOSIS — R6889 Other general symptoms and signs: Secondary | ICD-10-CM | POA: Diagnosis not present

## 2022-10-23 DIAGNOSIS — N179 Acute kidney failure, unspecified: Secondary | ICD-10-CM | POA: Diagnosis not present

## 2022-10-23 DIAGNOSIS — T40411A Poisoning by fentanyl or fentanyl analogs, accidental (unintentional), initial encounter: Secondary | ICD-10-CM | POA: Diagnosis not present

## 2022-10-23 DIAGNOSIS — E162 Hypoglycemia, unspecified: Secondary | ICD-10-CM | POA: Diagnosis not present

## 2022-11-05 DIAGNOSIS — Z419 Encounter for procedure for purposes other than remedying health state, unspecified: Secondary | ICD-10-CM | POA: Diagnosis not present

## 2022-11-19 ENCOUNTER — Telehealth: Payer: Self-pay

## 2022-11-19 NOTE — Telephone Encounter (Signed)
..   Medicaid Managed Care Note  11/19/2022 Name: OLA RAAP MRN: [id number] DOB: [birth year]  Charlotte Mitchell is a 31 y.o. year old female who is a primary care patient of Pcp, No and is actively engaged with the care management team. I reached out to [name] by phone today to assist with scheduling an initial visit with the RN Case Manager. I was not able to reach the patient or leave a message as the VM message was a female voice and did not list her name in the message.  Follow up plan: I will attempt to reach patient in the next 14 days.   Weston Settle Care Guide, High Risk Medicaid Managed Care Embedded Care Coordination [facility]  [facility]

## 2022-12-06 DIAGNOSIS — Z419 Encounter for procedure for purposes other than remedying health state, unspecified: Secondary | ICD-10-CM | POA: Diagnosis not present

## 2022-12-08 ENCOUNTER — Telehealth: Payer: Self-pay

## 2022-12-08 NOTE — Telephone Encounter (Signed)
..   Medicaid Managed Care Note  12/08/2022 Name: TREANA LACOUR MRN: [id number] DOB: 1991-11-07  ANVITA HIRATA is a 32 y.o. year old female who is a primary care patient of Pcp, No and is actively engaged with the care management team. I reached out to Christene Lye by phone today to assist with scheduling an initial visit with the RN Case Manager. No one answered the phone and the VM message was a female voice and the name was not listed in her chart. I did not leave a message.  Follow up plan: The care management team will reach out to the patient again over the next 14 days.   [place]

## 2023-01-06 DIAGNOSIS — Z419 Encounter for procedure for purposes other than remedying health state, unspecified: Secondary | ICD-10-CM | POA: Diagnosis not present

## 2023-02-04 DIAGNOSIS — Z419 Encounter for procedure for purposes other than remedying health state, unspecified: Secondary | ICD-10-CM | POA: Diagnosis not present

## 2023-10-27 ENCOUNTER — Inpatient Hospital Stay (HOSPITAL_BASED_OUTPATIENT_CLINIC_OR_DEPARTMENT_OTHER): Payer: Self-pay

## 2023-10-27 ENCOUNTER — Inpatient Hospital Stay (HOSPITAL_COMMUNITY): Payer: Self-pay | Admitting: Anesthesiology

## 2023-10-27 ENCOUNTER — Encounter (HOSPITAL_COMMUNITY): Payer: Self-pay

## 2023-10-27 ENCOUNTER — Encounter (HOSPITAL_COMMUNITY)
Admission: AD | Discharge status: Against Medical Advice | Payer: Self-pay | Source: Home / Self Care | Attending: Obstetrics & Gynecology

## 2023-10-27 ENCOUNTER — Other Ambulatory Visit: Payer: Self-pay

## 2023-10-27 ENCOUNTER — Inpatient Hospital Stay (HOSPITAL_COMMUNITY)
Admission: AD | Admit: 2023-10-27 | Discharge: 2023-10-28 | DRG: 783 | Discharge status: Against Medical Advice | Payer: Self-pay | Attending: Obstetrics & Gynecology | Admitting: Obstetrics & Gynecology

## 2023-10-27 DIAGNOSIS — F1190 Opioid use, unspecified, uncomplicated: Secondary | ICD-10-CM

## 2023-10-27 DIAGNOSIS — O0933 Supervision of pregnancy with insufficient antenatal care, third trimester: Secondary | ICD-10-CM

## 2023-10-27 DIAGNOSIS — F1120 Opioid dependence, uncomplicated: Secondary | ICD-10-CM | POA: Diagnosis present

## 2023-10-27 DIAGNOSIS — O99324 Drug use complicating childbirth: Secondary | ICD-10-CM | POA: Diagnosis present

## 2023-10-27 DIAGNOSIS — O99334 Smoking (tobacco) complicating childbirth: Secondary | ICD-10-CM | POA: Diagnosis present

## 2023-10-27 DIAGNOSIS — O34211 Maternal care for low transverse scar from previous cesarean delivery: Secondary | ICD-10-CM

## 2023-10-27 DIAGNOSIS — M4620 Osteomyelitis of vertebra, site unspecified: Secondary | ICD-10-CM | POA: Diagnosis present

## 2023-10-27 DIAGNOSIS — Z302 Encounter for sterilization: Secondary | ICD-10-CM

## 2023-10-27 DIAGNOSIS — R0689 Other abnormalities of breathing: Secondary | ICD-10-CM | POA: Diagnosis not present

## 2023-10-27 DIAGNOSIS — O34219 Maternal care for unspecified type scar from previous cesarean delivery: Secondary | ICD-10-CM

## 2023-10-27 DIAGNOSIS — Z9851 Tubal ligation status: Secondary | ICD-10-CM

## 2023-10-27 DIAGNOSIS — R000 Tachycardia, unspecified: Secondary | ICD-10-CM | POA: Diagnosis not present

## 2023-10-27 DIAGNOSIS — O0990 Supervision of high risk pregnancy, unspecified, unspecified trimester: Secondary | ICD-10-CM

## 2023-10-27 DIAGNOSIS — O99892 Other specified diseases and conditions complicating childbirth: Secondary | ICD-10-CM | POA: Diagnosis present

## 2023-10-27 DIAGNOSIS — Z3A34 34 weeks gestation of pregnancy: Secondary | ICD-10-CM

## 2023-10-27 DIAGNOSIS — Z3490 Encounter for supervision of normal pregnancy, unspecified, unspecified trimester: Principal | ICD-10-CM | POA: Insufficient documentation

## 2023-10-27 DIAGNOSIS — Z3A33 33 weeks gestation of pregnancy: Secondary | ICD-10-CM

## 2023-10-27 DIAGNOSIS — O99323 Drug use complicating pregnancy, third trimester: Secondary | ICD-10-CM

## 2023-10-27 DIAGNOSIS — F17210 Nicotine dependence, cigarettes, uncomplicated: Secondary | ICD-10-CM | POA: Diagnosis present

## 2023-10-27 DIAGNOSIS — O99019 Anemia complicating pregnancy, unspecified trimester: Secondary | ICD-10-CM | POA: Diagnosis present

## 2023-10-27 DIAGNOSIS — G061 Intraspinal abscess and granuloma: Secondary | ICD-10-CM | POA: Diagnosis present

## 2023-10-27 DIAGNOSIS — O4703 False labor before 37 completed weeks of gestation, third trimester: Secondary | ICD-10-CM

## 2023-10-27 DIAGNOSIS — Z3A39 39 weeks gestation of pregnancy: Secondary | ICD-10-CM

## 2023-10-27 DIAGNOSIS — O42913 Preterm premature rupture of membranes, unspecified as to length of time between rupture and onset of labor, third trimester: Principal | ICD-10-CM | POA: Diagnosis present

## 2023-10-27 DIAGNOSIS — Z98891 History of uterine scar from previous surgery: Principal | ICD-10-CM

## 2023-10-27 DIAGNOSIS — O42013 Preterm premature rupture of membranes, onset of labor within 24 hours of rupture, third trimester: Secondary | ICD-10-CM

## 2023-10-27 DIAGNOSIS — Z5329 Procedure and treatment not carried out because of patient's decision for other reasons: Secondary | ICD-10-CM | POA: Diagnosis present

## 2023-10-27 DIAGNOSIS — O9902 Anemia complicating childbirth: Secondary | ICD-10-CM | POA: Diagnosis present

## 2023-10-27 DIAGNOSIS — F1910 Other psychoactive substance abuse, uncomplicated: Secondary | ICD-10-CM | POA: Diagnosis present

## 2023-10-27 HISTORY — PX: IUD REMOVAL: SHX5392

## 2023-10-27 HISTORY — PX: WOUND DEBRIDEMENT: SHX247

## 2023-10-27 HISTORY — PX: REMOVAL OF NON VAGINAL CONTRACEPTIVE DEVICE: SHX6219

## 2023-10-27 LAB — HIV ANTIBODY (ROUTINE TESTING W REFLEX): HIV Screen 4th Generation wRfx: NONREACTIVE

## 2023-10-27 LAB — COMPREHENSIVE METABOLIC PANEL
ALT: 8 U/L (ref 0–44)
AST: 17 U/L (ref 15–41)
Albumin: 1.8 g/dL — ABNORMAL LOW (ref 3.5–5.0)
Alkaline Phosphatase: 120 U/L (ref 38–126)
Anion gap: 6 (ref 5–15)
BUN: 7 mg/dL (ref 6–20)
CO2: 23 mmol/L (ref 22–32)
Calcium: 8.1 mg/dL — ABNORMAL LOW (ref 8.9–10.3)
Chloride: 104 mmol/L (ref 98–111)
Creatinine, Ser: 0.57 mg/dL (ref 0.44–1.00)
GFR, Estimated: >60 mL/min (ref >60)
Glucose, Bld: 92 mg/dL (ref 70–99)
Potassium: 4 mmol/L (ref 3.5–5.1)
Sodium: 133 mmol/L — ABNORMAL LOW (ref 135–145)
Total Bilirubin: 0.3 mg/dL (ref <1.2)
Total Protein: 5.2 g/dL — ABNORMAL LOW (ref 6.5–8.1)

## 2023-10-27 LAB — CBC
HCT: 41.8 % (ref 36.0–46.0)
Hemoglobin: 12.8 g/dL (ref 12.0–15.0)
MCH: 31.6 pg (ref 26.0–34.0)
MCHC: 30.6 g/dL (ref 30.0–36.0)
MCV: 103.2 fL — ABNORMAL HIGH (ref 80.0–100.0)
Platelets: 189 10*3/uL (ref 150–400)
RBC: 4.05 MIL/uL (ref 3.87–5.11)
RDW: 13.1 % (ref 11.5–15.5)
WBC: 14.5 10*3/uL — ABNORMAL HIGH (ref 4.0–10.5)
nRBC: 0 % (ref 0.0–0.2)

## 2023-10-27 LAB — RPR: RPR Ser Ql: NONREACTIVE

## 2023-10-27 LAB — HEPATITIS C ANTIBODY: HCV Ab: REACTIVE — AB

## 2023-10-27 LAB — PROTEIN / CREATININE RATIO, URINE
Creatinine, Urine: 28 mg/dL
Protein Creatinine Ratio: 0.32 mg/mg{creat} — ABNORMAL HIGH (ref 0.00–0.15)
Total Protein, Urine: 9 mg/dL

## 2023-10-27 LAB — HEPATITIS B SURFACE ANTIGEN: Hepatitis B Surface Ag: NONREACTIVE

## 2023-10-27 LAB — GC/CHLAMYDIA PROBE AMP (~~LOC~~) NOT AT ARMC
Chlamydia: NEGATIVE
Comment: NEGATIVE
Comment: NORMAL
Neisseria Gonorrhea: NEGATIVE

## 2023-10-27 LAB — PREPARE RBC (CROSSMATCH)

## 2023-10-27 SURGERY — Surgical Case
Anesthesia: General | Site: Abdomen | Laterality: Bilateral

## 2023-10-27 MED ORDER — WITCH HAZEL-GLYCERIN EX PADS: 1.0000 | MEDICATED_PAD | CUTANEOUS | Status: DC | PRN

## 2023-10-27 MED ORDER — DIPHENHYDRAMINE HCL 12.5 MG/5ML PO ELIX: 12.5000 mg | ORAL_SOLUTION | Freq: Four times a day (QID) | ORAL | Status: DC | PRN

## 2023-10-27 MED ORDER — CEFAZOLIN SODIUM-DEXTROSE 2-4 GM/100ML-% IV SOLN
INTRAVENOUS | Status: AC
  Filled 2023-10-27: qty 100

## 2023-10-27 MED ORDER — NALOXONE HCL 0.4 MG/ML IJ SOLN: 0.4000 mg | INTRAMUSCULAR | Status: DC | PRN

## 2023-10-27 MED ORDER — SUCCINYLCHOLINE CHLORIDE 200 MG/10ML IV SOSY
PREFILLED_SYRINGE | INTRAVENOUS | Status: AC
  Filled 2023-10-27: qty 10

## 2023-10-27 MED ORDER — DIPHENHYDRAMINE HCL 50 MG/ML IJ SOLN: 12.5000 mg | Freq: Four times a day (QID) | INTRAMUSCULAR | Status: DC | PRN

## 2023-10-27 MED ORDER — MENTHOL 3 MG MT LOZG: 1.0000 | LOZENGE | OROMUCOSAL | Status: DC | PRN

## 2023-10-27 MED ORDER — LACTATED RINGERS IV BOLUS
1000.0000 mL | Freq: Once | INTRAVENOUS | Status: AC
  Administered 2023-10-27: 1000 mL via INTRAVENOUS

## 2023-10-27 MED ORDER — HYDROMORPHONE 1 MG/ML IV SOLN: INTRAVENOUS | Status: DC

## 2023-10-27 MED ORDER — SIMETHICONE 80 MG PO CHEW
80.0000 mg | CHEWABLE_TABLET | Freq: Three times a day (TID) | ORAL | Status: DC
  Administered 2023-10-27 – 2023-10-28 (×3): 80 mg via ORAL
  Filled 2023-10-27 (×3): qty 1

## 2023-10-27 MED ORDER — OXYTOCIN-SODIUM CHLORIDE 30-0.9 UT/500ML-% IV SOLN
INTRAVENOUS | Status: DC | PRN
  Administered 2023-10-27: 999 mL/h via INTRAVENOUS

## 2023-10-27 MED ORDER — KETAMINE HCL 10 MG/ML IJ SOLN
INTRAMUSCULAR | Status: DC | PRN
  Administered 2023-10-27: 50 mg via INTRAVENOUS

## 2023-10-27 MED ORDER — LIDOCAINE-EPINEPHRINE 2 %-1:100000 IJ SOLN
INTRAMUSCULAR | Status: DC | PRN
  Administered 2023-10-27: 60 mL via INTRADERMAL

## 2023-10-27 MED ORDER — HYDROMORPHONE HCL 1 MG/ML IJ SOLN: 0.2500 mg | INTRAMUSCULAR | Status: DC | PRN

## 2023-10-27 MED ORDER — LACTATED RINGERS IV SOLN: INTRAVENOUS | Status: DC | PRN

## 2023-10-27 MED ORDER — HYDROMORPHONE HCL 1 MG/ML IJ SOLN
INTRAMUSCULAR | Status: DC | PRN
  Administered 2023-10-27 (×2): 1 mg via INTRAVENOUS

## 2023-10-27 MED ORDER — SODIUM CHLORIDE 0.9% FLUSH: 9.0000 mL | INTRAVENOUS | Status: DC | PRN

## 2023-10-27 MED ORDER — BUPRENORPHINE HCL-NALOXONE HCL 8-2 MG SL SUBL: 1.0000 | SUBLINGUAL_TABLET | SUBLINGUAL | Status: DC | PRN

## 2023-10-27 MED ORDER — PROMETHAZINE HCL 25 MG/ML IJ SOLN: 25.0000 mg | Freq: Four times a day (QID) | INTRAMUSCULAR | Status: DC | PRN

## 2023-10-27 MED ORDER — BUPIVACAINE LIPOSOME 1.3 % IJ SUSP
INTRAMUSCULAR | Status: AC
  Filled 2023-10-27: qty 20

## 2023-10-27 MED ORDER — DEXMEDETOMIDINE HCL IN NACL 80 MCG/20ML IV SOLN
INTRAVENOUS | Status: DC | PRN
  Administered 2023-10-27: 8 ug via INTRAVENOUS

## 2023-10-27 MED ORDER — FENTANYL CITRATE (PF) 250 MCG/5ML IJ SOLN
INTRAMUSCULAR | Status: AC
  Filled 2023-10-27: qty 5

## 2023-10-27 MED ORDER — SENNOSIDES-DOCUSATE SODIUM 8.6-50 MG PO TABS
2.0000 | ORAL_TABLET | Freq: Every day | ORAL | Status: DC
  Administered 2023-10-28: 2 via ORAL
  Filled 2023-10-27: qty 2

## 2023-10-27 MED ORDER — ACETAMINOPHEN 325 MG PO TABS
650.0000 mg | ORAL_TABLET | ORAL | Status: DC | PRN
  Administered 2023-10-28: 650 mg via ORAL
  Filled 2023-10-27: qty 2

## 2023-10-27 MED ORDER — PROPOFOL 10 MG/ML IV BOLUS
INTRAVENOUS | Status: DC | PRN
  Administered 2023-10-27: 30 mg via INTRAVENOUS
  Administered 2023-10-27: 20 mg via INTRAVENOUS
  Administered 2023-10-27: 200 mg via INTRAVENOUS
  Administered 2023-10-27: 50 mg via INTRAVENOUS

## 2023-10-27 MED ORDER — FENTANYL CITRATE (PF) 100 MCG/2ML IJ SOLN
INTRAMUSCULAR | Status: AC
  Filled 2023-10-27: qty 2

## 2023-10-27 MED ORDER — COCONUT OIL OIL: 1.0000 | TOPICAL_OIL | Status: DC | PRN

## 2023-10-27 MED ORDER — OXYTOCIN-SODIUM CHLORIDE 30-0.9 UT/500ML-% IV SOLN: 2.5000 [IU]/h | INTRAVENOUS | Status: AC

## 2023-10-27 MED ORDER — MEASLES, MUMPS & RUBELLA VAC IJ SOLR: 0.5000 mL | Freq: Once | INTRAMUSCULAR | Status: DC

## 2023-10-27 MED ORDER — DIBUCAINE (PERIANAL) 1 % EX OINT: 1.0000 | TOPICAL_OINTMENT | CUTANEOUS | Status: DC | PRN

## 2023-10-27 MED ORDER — ENOXAPARIN SODIUM 40 MG/0.4ML IJ SOSY
40.0000 mg | PREFILLED_SYRINGE | INTRAMUSCULAR | Status: DC
  Administered 2023-10-27: 40 mg via SUBCUTANEOUS
  Filled 2023-10-27: qty 0.4

## 2023-10-27 MED ORDER — LACTATED RINGERS IV SOLN: INTRAVENOUS | Status: AC

## 2023-10-27 MED ORDER — SIMETHICONE 80 MG PO CHEW: 80.0000 mg | CHEWABLE_TABLET | ORAL | Status: DC | PRN

## 2023-10-27 MED ORDER — TRANEXAMIC ACID-NACL 1000-0.7 MG/100ML-% IV SOLN
INTRAVENOUS | Status: AC
  Filled 2023-10-27: qty 100

## 2023-10-27 MED ORDER — MIDAZOLAM HCL 2 MG/2ML IJ SOLN
INTRAMUSCULAR | Status: DC | PRN
  Administered 2023-10-27: 2 mg via INTRAVENOUS

## 2023-10-27 MED ORDER — KETOROLAC TROMETHAMINE 30 MG/ML IJ SOLN
30.0000 mg | Freq: Four times a day (QID) | INTRAMUSCULAR | Status: AC
  Administered 2023-10-27 – 2023-10-28 (×4): 30 mg via INTRAVENOUS
  Filled 2023-10-27 (×4): qty 1

## 2023-10-27 MED ORDER — MUPIROCIN CALCIUM 2 % EX CREA
TOPICAL_CREAM | Freq: Every day | CUTANEOUS | Status: DC
  Filled 2023-10-27: qty 15

## 2023-10-27 MED ORDER — SODIUM CHLORIDE 0.9 % IV SOLN: 12.5000 mg | INTRAVENOUS | Status: DC | PRN

## 2023-10-27 MED ORDER — SODIUM CHLORIDE 0.9 % IR SOLN
Status: DC | PRN
  Administered 2023-10-27: 1

## 2023-10-27 MED ORDER — SODIUM CHLORIDE 0.9 % IV SOLN
500.0000 mg | INTRAVENOUS | Status: AC
  Administered 2023-10-27: 500 mg via INTRAVENOUS
  Filled 2023-10-27: qty 5

## 2023-10-27 MED ORDER — FENTANYL CITRATE (PF) 100 MCG/2ML IJ SOLN
INTRAMUSCULAR | Status: DC | PRN
  Administered 2023-10-27: 250 ug via INTRAVENOUS

## 2023-10-27 MED ORDER — METOCLOPRAMIDE HCL 5 MG/ML IJ SOLN
INTRAMUSCULAR | Status: AC
  Filled 2023-10-27: qty 2

## 2023-10-27 MED ORDER — LACTATED RINGERS IV SOLN: INTRAVENOUS | Status: DC

## 2023-10-27 MED ORDER — DIPHENHYDRAMINE HCL 50 MG/ML IJ SOLN
INTRAMUSCULAR | Status: DC | PRN
  Administered 2023-10-27: 12.5 mg via INTRAVENOUS

## 2023-10-27 MED ORDER — METOCLOPRAMIDE HCL 5 MG/ML IJ SOLN
INTRAMUSCULAR | Status: DC | PRN
  Administered 2023-10-27: 10 mg via INTRAVENOUS

## 2023-10-27 MED ORDER — MIDAZOLAM HCL 2 MG/2ML IJ SOLN
INTRAMUSCULAR | Status: AC
  Filled 2023-10-27: qty 2

## 2023-10-27 MED ORDER — IBUPROFEN 600 MG PO TABS
600.0000 mg | ORAL_TABLET | Freq: Four times a day (QID) | ORAL | Status: DC
  Administered 2023-10-28: 600 mg via ORAL
  Filled 2023-10-27: qty 1

## 2023-10-27 MED ORDER — FENTANYL CITRATE (PF) 100 MCG/2ML IJ SOLN
50.0000 ug | Freq: Once | INTRAMUSCULAR | Status: AC
  Administered 2023-10-27: 50 ug via INTRAVENOUS

## 2023-10-27 MED ORDER — ACETAMINOPHEN 10 MG/ML IV SOLN
INTRAVENOUS | Status: DC | PRN
  Administered 2023-10-27: 1000 mg via INTRAVENOUS

## 2023-10-27 MED ORDER — PRENATAL MULTIVITAMIN CH
1.0000 | ORAL_TABLET | Freq: Every day | ORAL | Status: DC
  Administered 2023-10-28: 1 via ORAL
  Filled 2023-10-27: qty 1

## 2023-10-27 MED ORDER — OXYTOCIN-SODIUM CHLORIDE 30-0.9 UT/500ML-% IV SOLN
INTRAVENOUS | Status: AC
  Filled 2023-10-27: qty 500

## 2023-10-27 MED ORDER — SOD CITRATE-CITRIC ACID 500-334 MG/5ML PO SOLN
30.0000 mL | ORAL | Status: AC
  Administered 2023-10-27: 30 mL via ORAL
  Filled 2023-10-27: qty 30

## 2023-10-27 MED ORDER — HYDROMORPHONE HCL 1 MG/ML IJ SOLN
INTRAMUSCULAR | Status: AC
  Filled 2023-10-27: qty 2

## 2023-10-27 MED ORDER — ACETAMINOPHEN 10 MG/ML IV SOLN
INTRAVENOUS | Status: AC
  Filled 2023-10-27: qty 100

## 2023-10-27 MED ORDER — KETOROLAC TROMETHAMINE 30 MG/ML IJ SOLN
INTRAMUSCULAR | Status: DC | PRN
  Administered 2023-10-27: 30 mg via INTRAVENOUS

## 2023-10-27 MED ORDER — GABAPENTIN 300 MG PO CAPS
300.0000 mg | ORAL_CAPSULE | Freq: Three times a day (TID) | ORAL | Status: DC
  Administered 2023-10-27 – 2023-10-28 (×4): 300 mg via ORAL
  Filled 2023-10-27 (×4): qty 1

## 2023-10-27 MED ORDER — DEXAMETHASONE SODIUM PHOSPHATE 10 MG/ML IJ SOLN
INTRAMUSCULAR | Status: AC
  Filled 2023-10-27: qty 1

## 2023-10-27 MED ORDER — SUCCINYLCHOLINE CHLORIDE 200 MG/10ML IV SOSY
PREFILLED_SYRINGE | INTRAVENOUS | Status: DC | PRN
  Administered 2023-10-27: 100 mg via INTRAVENOUS

## 2023-10-27 MED ORDER — BUPRENORPHINE HCL-NALOXONE HCL 8-2 MG SL SUBL
2.0000 | SUBLINGUAL_TABLET | Freq: Once | SUBLINGUAL | Status: AC
Start: 2023-10-27 — End: 2023-10-28
  Administered 2023-10-28: 2 via SUBLINGUAL
  Filled 2023-10-27 (×2): qty 2

## 2023-10-27 MED ORDER — CEFAZOLIN SODIUM-DEXTROSE 2-4 GM/100ML-% IV SOLN
2.0000 g | INTRAVENOUS | Status: AC
  Administered 2023-10-27: 2 g via INTRAVENOUS
  Filled 2023-10-27: qty 100

## 2023-10-27 MED ORDER — TRANEXAMIC ACID-NACL 1000-0.7 MG/100ML-% IV SOLN
1000.0000 mg | Freq: Once | INTRAVENOUS | Status: AC
  Administered 2023-10-27: 1000 mg via INTRAVENOUS

## 2023-10-27 MED ORDER — KETAMINE HCL 50 MG/5ML IJ SOSY
PREFILLED_SYRINGE | INTRAMUSCULAR | Status: AC
  Filled 2023-10-27: qty 5

## 2023-10-27 MED ORDER — DEXAMETHASONE SODIUM PHOSPHATE 10 MG/ML IJ SOLN
INTRAMUSCULAR | Status: DC | PRN
  Administered 2023-10-27: 10 mg via INTRAVENOUS

## 2023-10-27 MED ORDER — OXYTOCIN-SODIUM CHLORIDE 30-0.9 UT/500ML-% IV SOLN: INTRAVENOUS | Status: DC | PRN

## 2023-10-27 MED ORDER — BUPIVACAINE LIPOSOME 1.3 % IJ SUSP
INTRAMUSCULAR | Status: DC | PRN
  Administered 2023-10-27: 20 mL

## 2023-10-27 MED ORDER — STERILE WATER FOR IRRIGATION IR SOLN
Status: DC | PRN
  Administered 2023-10-27: 1000 mL

## 2023-10-27 MED ORDER — TETANUS-DIPHTH-ACELL PERTUSSIS 5-2.5-18.5 LF-MCG/0.5 IM SUSY: 0.5000 mL | PREFILLED_SYRINGE | Freq: Once | INTRAMUSCULAR | Status: DC

## 2023-10-27 SURGICAL SUPPLY — 30 items
BENZOIN TINCTURE PRP APPL 2/3 (GAUZE/BANDAGES/DRESSINGS) ×3 IMPLANT
DERMABOND ADVANCED .7 DNX12 (GAUZE/BANDAGES/DRESSINGS) ×4 IMPLANT
DRSG OPSITE POSTOP 4X10 (GAUZE/BANDAGES/DRESSINGS) ×3 IMPLANT
ELECT REM PT RETURN 9FT ADLT (ELECTROSURGICAL) ×3
ELECTRODE REM PT RTRN 9FT ADLT (ELECTROSURGICAL) ×3 IMPLANT
GLOVE BIOGEL PI IND STRL 7.0 (GLOVE) ×9 IMPLANT
GLOVE ECLIPSE 6.5 STRL STRAW (GLOVE) ×3 IMPLANT
GOWN STRL REUS W/TWL LRG LVL3 (GOWN DISPOSABLE) ×9 IMPLANT
HEMOSTAT ARISTA ABSORB 3G PWDR (HEMOSTASIS) ×1 IMPLANT
KIT ABG SYR 3ML LUER SLIP (SYRINGE) IMPLANT
NDL HYPO 25X5/8 SAFETYGLIDE (NEEDLE) IMPLANT
NEEDLE HYPO 25X5/8 SAFETYGLIDE (NEEDLE) IMPLANT
NS IRRIG 1000ML POUR BTL (IV SOLUTION) ×3 IMPLANT
PACK C SECTION WH (CUSTOM PROCEDURE TRAY) ×3 IMPLANT
PAD ABD 7.5X8 STRL (GAUZE/BANDAGES/DRESSINGS) ×3 IMPLANT
PAD OB MATERNITY 4.3X12.25 (PERSONAL CARE ITEMS) ×3 IMPLANT
RTRCTR C-SECT PINK 25CM LRG (MISCELLANEOUS) ×3 IMPLANT
STRIP CLOSURE SKIN 1/2X4 (GAUZE/BANDAGES/DRESSINGS) ×1 IMPLANT
SUT MNCRL 0 VIOLET CTX 36 (SUTURE) ×9 IMPLANT
SUT PLAIN 0 NONE (SUTURE) IMPLANT
SUT PLAIN 2 0 XLH (SUTURE) IMPLANT
SUT VIC AB 0 CT1 27XBRD ANBCTR (SUTURE) ×6 IMPLANT
SUT VIC AB 2-0 CT1 TAPERPNT 27 (SUTURE) ×3 IMPLANT
SUT VIC AB 3-0 SH 27XBRD (SUTURE) ×3 IMPLANT
SUT VIC AB 4-0 KS 27 (SUTURE) ×3 IMPLANT
SUT VIC AB 4-0 SH 27XANBCTRL (SUTURE) ×1 IMPLANT
SUT VIC AB CT1 27XBRD ANBCTRL (SUTURE) ×6
TOWEL OR 17X24 6PK STRL BLUE (TOWEL DISPOSABLE) ×3 IMPLANT
TRAY FOLEY W/BAG SLVR 14FR LF (SET/KITS/TRAYS/PACK) ×1 IMPLANT
WATER STERILE IRR 1000ML POUR (IV SOLUTION) ×3 IMPLANT

## 2023-10-27 NOTE — Op Note (Addendum)
[name] Procedure Date: 10/27/23  PreOp Diagnosis: Intrauterine pregnancy of uncertain gestational age (suspected to be 34 weeks), prolonged preterm rupture of membranes, history of cesarean delivery x 4, desire for sterilization, expired Nexplanon in place, multiple scabs  PostOp Diagnosis: Moderate adhesive disease of bladder, 4cm uterine window of left lower uterine segment, viable female infant, multiple scabs (left breast, right ankle, left knee)  Procedure: Repeat Low Transverse Cesarean Section, Bilateral Salpingectomy, Nexplanon removal  Surgeon: Dr. Myna Hidalgo Assistant: Sundra Aland, MD An experienced assistant was required given the standard of surgical care given the complexity of the case.  This assistant was needed for exposure, dissection, suctioning, retraction, instrument exchange, assisting with delivery with administration of fundal pressure, and for overall help during the procedure.  Anesthesia: Anesthesiologist: Marcene Duos, MD CRNA: Rica Records, CRNA; Sterling, Charlesetta M, CRNA  Complications: None EBL: 413cc UOP: 150cc Fluids: 1400cc  INDICATIONS: Charlotte Mitchell is a 32 y.o. [id number] at [redacted]w[redacted]d per reported LMP who is here for cesarean section secondary to the indications listed under preoperative diagnoses; please see preoperative note for further details.  The risks of surgery were discussed with the patient including but were not limited to: bleeding which may require transfusion or reoperation; infection which may require antibiotics; injury to bowel, bladder, ureters or other surrounding organs; injury to the fetus; need for additional procedures including hysterectomy in the event of a life-threatening hemorrhage; formation of adhesions; placental abnormalities wth subsequent pregnancies; incisional problems; thromboembolic phenomenon and other postoperative/anesthesia complications.  The patient concurred with the proposed plan, giving  informed written consent for the procedure.    Patient desires permanent sterilization.  Other reversible forms of contraception were discussed with patient; she declines all other modalities. Risks of procedure discussed with patient including but not limited to: risk of regret, permanence of method, bleeding, infection, and potential injury to surrounding organs.  Discussed that especially in setting of salpingectomy, risk of ectopic pregnancy low, less than 1%.  Also discussed possibility of post-tubal pain syndrome. Patient verbalized understanding of these risks and wants to proceed with sterilization.    Findings: Term live female infant, normal fallopian tubes and ovaries, moderate adhesive disease of abdomen.  Left breast, right ankle and left inner knee with with areas of slow-healing scabs- no evidence of cellulitis  PROCEDURE:  Informed consent was obtained from the patient with risks, benefits, complications, treatment options, and expected outcomes discussed with the patient.  The patient concurred with the proposed plan, giving informed consent with form signed.   The patient was taken to Operating Room, and identified with the procedure verified as C-Section Delivery with Time Out. With induction of anesthesia, the patient was prepped and draped in the usual sterile fashion. A Pfannenstiel incision was made and carried down through the subcutaneous tissue to the fascia. The fascia was incised in the midline and extended transversely using Mayo scissors. The superior aspect of the fascial incision was grasped with Kochers elevated and the underlying muscle dissected off. The inferior aspect of the facial incision was in similar fashion, grasped elevated and rectus muscles dissected off. A window into the peritoneal cavity was identified and entered bluntly. Alexis retractor was placed. Moderate adhesive disease of the bladder was noted and taken down sharply with the Mezenbaum.  At that time,  the large uterine window was noted of the left lower uterine segment. The window was then extended and the infants head delivered atraumatically. Terminal meconium was noted. After the umbilical cord was  clamped. Attempt was made to collect cord gases and cord blood, but was unable to obtain. The placenta was removed intact and appeared normal. The uterine outline, tubes and ovaries appeared normal. The hysterotomy was closed with running sutures of 0 vicryl. The defect of the hysterotomy was closed separately with 0 vicryl in a running fashion. Excellent hemostasis was obtained.   Attention was then turned to the left fallopian tube. Kelly forceps were placed on the mesosalpinx underneath most of the tube.  This pedicle was double suture ligated with Plain Gut then 2-0 Vicryl, and the tube including the fimbriated end was excised.  The right fallopian tube was excised in a similar fashion allowing for bilateral tubal sterilization via bilateral salpingectomy.  Good hemostasis was noted overall.    Alexis retractor was removed. The hysterotomy was re-evaluated after removal of Alexis retractor with excellent hemostasis noted. Arista powder was applied over the hysterotomy. The fascia was then reapproximated with running sutures of 0 Vicryl. 20cc of Bupivacaine 1.3% was infiltrated into the superior and inferior aspects of the fascial incision. The subcutaneous tissue was reapproximated with 2-0 plain gut suture. The skin was closed with 4-0 vicryl in a subcuticular fashion.  Instrument, sponge, and needle counts were correct prior the abdominal closure and at the conclusion of the case.   Attention was then turned to the patient's left arm.  Nexplanon was identified.  The area prepped in usual sterile fashon. A small stab incision was made right beside the implant on the distal portion.  The Nexplanon rod was grasped using hemostats and removed intact without difficulty.  Steri-strips were applied.  There  was less than 3 cc blood loss. There were no complications.   Attention was then turned to each scab.  Starting at her left breast- the old dressing was removed.  The area was examined and copiously irrigated.  A clean dressing was applied.  A similar procedure was carried out in the other 2 locations.  The patient was taken to recovery in stable condition.  Sundra Aland, MD OB Fellow, Faculty [facility], Center for [facility]  10/27/23  6:40 AM

## 2023-10-27 NOTE — Discharge Summary (Signed)
Postpartum Discharge Summary - Patient left Against Medical Advice     Patient Name: Charlotte Mitchell DOB: 1991/08/27 MRN: [id number]  Date of admission: 10/27/2023 Delivery date:10/27/2023 Delivering provider: Myna Hidalgo Date of discharge: 10/28/2023  Admitting diagnosis: Intrauterine pregnancy [Z34.90] Intrauterine pregnancy: [redacted]w[redacted]d     Secondary diagnosis:  Principal Problem:   Status post repeat low transverse cesarean section Active Problems:   Opioid use disorder, severe, dependence (HCC)   Paraspinal abscess (HCC)   History of cesarean section   Substance abuse affecting pregnancy in third trimester, antepartum (HCC)   Supervision of high risk pregnancy, antepartum   Anemia affecting pregnancy   S/P bilateral salpingectomy for sterilization  Additional problems: no prenatal care, opioid use, left against medical advice    Discharge diagnosis: Preterm Pregnancy Delivered                                              Post partum procedures: none Augmentation: N/A Complications: None  Hospital course: Sceduled C/S   32 y.o. yo [id number] at [redacted]w[redacted]d was admitted to the hospital 10/27/2023 .Delivery details are as follows:  Membrane Rupture Time/Date: 5:04 AM,10/27/2023  Delivery Method:C-Section, Low Transverse  Details of operation can be found in separate operative note. She also underwent bilateral salpingectomy for sterilization.   Patient had a postpartum course complicated by her opioid dependence and was started on Suboxone. Patient left against medical advice on POD#1; she left without any prescriptions or plans for follow up.        Newborn Data: Birth date:10/27/2023 Birth time:5:05 AM Gender:Female Living status:Living Apgars:8 ,9  Weight:2310 g    Magnesium Sulfate received: No BMZ received: No Rhophylac:No MMR:No  Physical exam (not done as she left AMA) Vitals:   10/28/23 0311 10/28/23 0315 10/28/23 0755 10/28/23 1633  BP: 105/69  138/85 137/64   Pulse: 66  61 (!) 45  Resp:   18 16  Temp:   97.8 F (36.6 C) (!) 97.4 F (36.3 C)  TempSrc:   Oral Oral  SpO2:  98%  99%  Weight:      Height:       Labs: Lab Results  Component Value Date   WBC 12.4 (H) 10/28/2023   HGB 8.5 (L) 10/28/2023   HCT 26.2 (L) 10/28/2023   MCV 95.3 10/28/2023   PLT 149 (L) 10/28/2023      Latest Ref Rng & Units 10/27/2023    3:13 AM  CMP  Glucose 70 - 99 mg/dL 92   BUN 6 - 20 mg/dL 7   Creatinine 8.65 - 7.84 mg/dL 6.96   Sodium 295 - 284 mmol/L 133   Potassium 3.5 - 5.1 mmol/L 4.0   Chloride 98 - 111 mmol/L 104   CO2 22 - 32 mmol/L 23   Calcium 8.9 - 10.3 mg/dL 8.1   Total Protein 6.5 - 8.1 g/dL 5.2   Total Bilirubin <1.3 mg/dL 0.3   Alkaline Phos 38 - 126 U/L 120   AST 15 - 41 U/L 17   ALT 0 - 44 U/L 8    Edinburgh Score:     No data to display         No data recorded  After visit meds:  Allergies as of 10/28/2023       Reactions   Hydrocodone Itching   Zofran Frazier Richards Hcl] Other (  See Comments)   "Blacked out for 6 hours"        Medication List     STOP taking these medications    ferrous sulfate 325 (65 FE) MG tablet   ibuprofen 600 MG tablet Commonly known as: ADVIL   Oxycodone HCl 10 MG Tabs        Discharge home in stable condition Infant Feeding: Bottle Infant Disposition:NICU Discharge instruction: per After Visit Summary and Postpartum booklet. Activity: Advance as tolerated. Pelvic rest for 6 weeks.  Diet: routine diet Future Appointments:No future appointments. Follow up Visit:   Please schedule this patient for a In person postpartum visit in 1 week with the following provider: MD- if possible Dr. Crissie Reese- for OUD clinic Additional Postpartum F/U:Incision check 1 week  High risk pregnancy complicated by:  Opioid use, no prenatal care Delivery mode:  C-Section, Low Transverse Anticipated Birth Control:   bilateral salpingectomy   10/28/2023 Jaynie Collins, MD

## 2023-10-27 NOTE — Anesthesia Procedure Notes (Signed)
Procedure Name: Intubation Date/Time: 10/27/2023 5:01 AM  Performed by: Armanda Heritage, CRNAPre-anesthesia Checklist: Patient identified and Emergency Drugs available Patient Re-evaluated:Patient Re-evaluated prior to induction Oxygen Delivery Method: Circle system utilized Preoxygenation: Pre-oxygenation with 100% oxygen Induction Type: IV induction, Rapid sequence and Cricoid Pressure applied Laryngoscope Size: Glidescope Grade View: Grade II Tube type: Oral Tube size: 6.5 mm Number of attempts: 1 Airway Equipment and Method: Video-laryngoscopy Placement Confirmation: ETT inserted through vocal cords under direct vision, positive ETCO2 and breath sounds checked- equal and bilateral Secured at: 21 cm Tube secured with: Tape Dental Injury: Teeth and Oropharynx as per pre-operative assessment

## 2023-10-27 NOTE — MAU Note (Addendum)
.  Charlotte Mitchell is a 32 y.o. at Unknown here in MAU reporting: here by EMS - pt reports ctx that started an hour ago. Reports leaking clear watery fluid for a couple of days. Denies VB. +FM. Has not received prenatal care - states "I'm past due". Reports fentanyl and meth use 5 hours ago.  LMP: end of march  Onset of complaint: 0145 Pain score: 10 Vitals:   10/27/23 0248  BP: (!) 135/98  Pulse: 93  Resp: 20  Temp: 97.7 F (36.5 C)     FHT:135

## 2023-10-27 NOTE — Consult Note (Signed)
WOC Nurse Consult Note: Reason for Consult: Consult requested for several wounds, reportedly related to IV drug use, according to the EMR.  Wound type: Severeal patchy areas of dry brown scabs scattered over body.  Full thickness wounds in the following locations; none have odor, drainage, or fluctuance. Left breast: .2X4X.2cm, dry red wound bed Left inner knee .2X.2X.2cm and .2X.3X.2cm Right ankle .8X.8X.3cm Dressing procedure/placement/frequency:  Topical treatment orders provided for bedside nurses to perform as follows to provide antimicrobial benefits and promote moist healing: Apply Bactroban to left breast, right ankle, left inner knee wounds Q day, then cover with foam dressings [facility] [place] # [phone]) or gauze and tape. Please re-consult if further assistance is needed.  Thank-you,  Cammie Mcgee MSN, RN, CWOCN, [place], CNS [phone]

## 2023-10-27 NOTE — Progress Notes (Signed)
Suboxone induction plan - monitor on COWS score - when COWS score is =>12, give 16 mg of suboxone - reassess in 45 minutes, if COWS remains >8, give additional dose of 8 mg of suboxone (total dose at this point 24 mg). If COWS <8 no additional suboxone needed until AM. - reassess in 45 minutes, if COWS remains >8, give additional dose of 8 mg of suboxone (total dose at this point 32 mg). Do not give any additional suboxone until AM - in AM write patient for total given induction dose to be given in divided dosing   Venora Maples, MD, MPH, FAAFP Attending Family Medicine Physician, [facility] for [facility], [facility], [facility]

## 2023-10-27 NOTE — Transfer of Care (Signed)
Immediate Anesthesia Transfer of Care Note  Patient: Charlotte Mitchell  Procedure(s) Performed: CESAREAN SECTION WITH BILATERAL TUBAL LIGATION (Bilateral: Abdomen)  Patient Location: PACU  Anesthesia Type:General  Level of Consciousness: awake, alert , and sedated  Airway & Oxygen Therapy: Patient Spontanous Breathing and Patient connected to nasal cannula oxygen  Post-op Assessment: Report given to RN and Post -op Vital signs reviewed and stable  Post vital signs: Reviewed and stable  Last Vitals:  Vitals Value Taken Time  BP 113/79 10/27/23 0630  Temp 35.9 C 10/27/23 0620  Pulse 74 10/27/23 0635  Resp 17 10/27/23 0635  SpO2 97 % 10/27/23 0635  Vitals shown include unfiled device data.  Last Pain:  Vitals:   10/27/23 0620  TempSrc: Axillary  PainSc: Asleep         Complications: No notable events documented.

## 2023-10-27 NOTE — H&P (Addendum)
Obstetric Preoperative History and Physical  Charlotte Mitchell is a 32 y.o. [id number] @ ~ [redacted]w[redacted]d by LMP presents to [facility] complaining of painful contractions since this am.  Pt also reporting leaking fluid for several days.  No vaginal bleeding.  +fetal movement  Cesarean Section Indication:  prior C-section x 4  Pregnancy complicated by: -no prenatal care -Opoid use- reports last use of Fentanyl ~ 5 hours ago - pt desires permanent sterilization -expired Nexplanon  Pregnancy complications or risks: Patient Active Problem List   Diagnosis Date Noted   Intrauterine pregnancy 10/27/2023   Status post repeat low transverse cesarean section 02/24/2022   Active preterm labor 02/24/2022   PROM (premature rupture of membranes) 02/22/2022   History of cesarean section 01/08/2022   Substance abuse affecting pregnancy in third trimester, antepartum (HCC) 01/08/2022   Supervision of high risk pregnancy, antepartum 01/08/2022   Anemia affecting pregnancy 01/08/2022   Paraspinal abscess (HCC)    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   Discitis of sacral region 05/22/2018   Opioid use disorder, severe, dependence (HCC) 10/09/2017    She desires bilateral tubal ligation for postpartum contraception.   Prenatal labs and studies: ABO, Rh:  to be collected Antibody:   Rubella:   RPR:    HBsAg:    HIV:    GBS:    Prenatal Transfer Tool  Maternal Diabetes: unknown Genetic Screening: not completed Maternal Ultrasounds/Referrals: not completed Fetal Ultrasounds or other Referrals:  not completed Maternal Substance Abuse:  Yes:  Type: Other: Fentanyl Significant Maternal Medications:  None Significant Maternal Lab Results: pending  Past Medical History:  Diagnosis Date   Anxiety    IV drug abuse (HCC)    Seizures (HCC) 2016   "from methadone withdrawal"   Substance abuse [facility])     Past Surgical History:  Procedure Laterality Date   APPENDECTOMY     CESAREAN SECTION  07/2008; 11/2006    CESAREAN SECTION  02/03/2012   Procedure: CESAREAN SECTION;  Surgeon: Loney Laurence, MD;  Location: [facility] ORS;  Service: Gynecology;  Laterality: N/A;   CESAREAN SECTION N/A 02/22/2022   Procedure: CESAREAN SECTION;  Surgeon: Venora Maples, MD;  Location: [facility] LD ORS;  Service: Obstetrics;  Laterality: N/A;   I & D EXTREMITY Right 04/10/2021   Procedure: 1.  Irrigation and excisional debridement of right index finger open PIP dislocation;  Surgeon: Ernest Mallick, MD;  Location: [facility] OR;  Service: Orthopedics;  Laterality: Right;   PERCUTANEOUS PINNING Right 04/10/2021   Procedure:  2.  Open reduction and pin fixation of PIP dislocation to the right index finger 3.  Repair of the ulnar collateral ligament of the PIP joint to the right index finger 4.  Neurolysis of the ulnar digital nerve to the right index finger;  Surgeon: Ernest Mallick, MD;  Location: [facility] OR;  Service: Orthopedics;  Laterality: Right;   RADIOLOGY WITH ANESTHESIA N/A 05/22/2018   Procedure: MRI WITH ANESTHESIA;  Surgeon: Radiologist, Medication, MD;  Location: [facility] OR;  Service: Radiology;  Laterality: N/A;   TONSILLECTOMY      OB History  Gravida Para Term Preterm AB Living  5 4 4     4   SAB IAB Ectopic Multiple Live Births        0 4    # Outcome Date GA Lbr Len/2nd Weight Sex Type Anes PTL Lv  5 Current           4 Term 02/22/22 [redacted]w[redacted]d  3280 g M CS-LTranv Gen  LIV     Birth Comments: Grossly normal. Bilateral eyelid eversion noted  3 Term 02/03/12 [redacted]w[redacted]d  3080 g F CS-LTranv Spinal  LIV  2 Term 07/2008    M CS-Unspec EPI  LIV  1 Term 11/2006    F CS-Unspec EPI  LIV    Social History   Socioeconomic History   Marital status: Single    Spouse name: Not on file   Number of children: Not on file   Years of education: Not on file   Highest education level: Not on file  Occupational History   Not on file  Tobacco Use   Smoking status: Every Day    Current packs/day: 1.00    Average packs/day: 1 pack/day  for 16.0 years (16.0 ttl pk-yrs)    Types: Cigarettes   Smokeless tobacco: Never  Vaping Use   Vaping status: Former  Substance and Sexual Activity   Alcohol use: Not Currently   Drug use: Yes    Types: Heroin, "Crack" cocaine, Fentanyl    Comment: Last used 01/08/22   Sexual activity: Yes  Other Topics Concern   Not on file  Social History Narrative   Not on file   Social Determinants of Health   Financial Resource Strain: Not on file  Food Insecurity: Not on file  Transportation Needs: Not on file  Physical Activity: Not on file  Stress: Not on file  Social Connections: Not on file    No family history on file.  Medications Prior to Admission  Medication Sig Dispense Refill Last Dose   ferrous sulfate 325 (65 FE) MG tablet Take 1 tablet (325 mg total) by mouth every other day. 30 tablet 0    ibuprofen (ADVIL) 600 MG tablet Take 1 tablet (600 mg total) by mouth every 6 (six) hours. 30 tablet 0    oxyCODONE 10 MG TABS Take 1 tablet (10 mg total) by mouth every 6 (six) hours as needed for severe pain. 5 tablet 0     Allergies  Allergen Reactions   Hydrocodone Itching   Zofran [Ondansetron Hcl] Other (See Comments)    "Blacked out for 6 hours"    Review of Systems: Pertinent items noted in HPI and remainder of comprehensive ROS otherwise negative.  Physical Exam: BP (!) 135/98 (BP Location: Right Arm)   Pulse 93   Temp 97.7 F (36.5 C) (Oral)   Resp 20   LMP 02/28/2023 (Approximate)  FHR by Doppler: 145 bpm CONSTITUTIONAL: pt in acute distress, writhing in bed due to discomfort  NECK:  SKIN: Skin is warm and dry. R hand edema with erythema.  Multiple sites of unhealed ulcers- noted on left breast, right ankle and left leg- pt did not allow for further examination due to discomfort. NEUROLGIC: tearful and uncomfortable.  Difficulty sitting still CARDIOVASCULAR: Normal heart rate noted, regular rhythm RESPIRATORY: Effort and breath sounds normal, no problems with  respiration noted ABDOMEN: Gravid with contractions PELVIC: vertex, 2/90/-2 MUSCULOSKELETAL: No calf tenderness bilaterally  FHT: difficulty monitoring due to maternal movement 150bpm, moderate variability, variable decels noted, +accel  Pertinent Labs/Studies:   No results found for this or any previous visit (from the past 72 hour(s)).  Assessment and Plan: Charlotte Mitchell is a 32 y.o. [id number] at [redacted]w[redacted]d presented for PPROM/labor. Due to prior C-section x 4, plan to proceed with repeat C-section.  Pt also desires permanent sterilization- via tubal ligation.  Also plan to removal Nexplanon. The risks of  surgery were discussed with the patient including but were not limited to: bleeding which may require transfusion or reoperation; infection which may require antibiotics; injury to bowel, bladder, ureters or other surrounding organs; injury to the fetus; need for additional procedures including hysterectomy in the event of a life-threatening hemorrhage; formation of adhesions; placental abnormalities wth subsequent pregnancies; incisional problems; thromboembolic phenomenon and other postoperative/anesthesia complications. The patient concurred with the proposed plan, giving informed written consent for the procedure.  Risks of procedure discussed with patient including but not limited to: risk of regret, permanence of method, bleeding, infection, and potential injury to surrounding organs.  Discussed that especially in setting of salpingectomy, risk of ectopic pregnancy low, less than 1%.  Patient verbalized understanding of these risks and wants to proceed with sterilization.  Written informed consent obtained.   Pending fetal status, will wait for CBC, T&C and will then proceed to OR when ready.  Katha Hamming, DO Obstetrician & Gynecologist, [facility] for [employer], [facility]

## 2023-10-27 NOTE — Anesthesia Preprocedure Evaluation (Addendum)
Anesthesia Evaluation  Patient identified by MRN, date of birth, ID band Patient awake    Reviewed: Allergy & Precautions, NPO status , Patient's Chart, lab work & pertinent test results  Airway Mallampati: II  TM Distance: >3 FB Neck ROM: Full    Dental  (+) Dental Advisory Given   Pulmonary Current Smoker   breath sounds clear to auscultation       Cardiovascular negative cardio ROS  Rhythm:Regular Rate:Normal     Neuro/Psych Seizures -,     GI/Hepatic negative GI ROS,,,(+)     substance abuse  IV drug use  Endo/Other  negative endocrine ROS    Renal/GU negative Renal ROS     Musculoskeletal  (+) Arthritis ,  narcotic dependent  Abdominal   Peds  Hematology  (+) Blood dyscrasia, anemia   Anesthesia Other Findings   Reproductive/Obstetrics                             Anesthesia Physical Anesthesia Plan  ASA: 3 and emergent  Anesthesia Plan: General   Post-op Pain Management: Ofirmev IV (intra-op)*, Toradol IV (intra-op)* and Ketamine IV*   Induction: Intravenous, Rapid sequence and Cricoid pressure planned  PONV Risk Score and Plan: 2 and Dexamethasone and Ondansetron  Airway Management Planned: Oral ETT and Video Laryngoscope Planned  Additional Equipment:   Intra-op Plan:   Post-operative Plan: Extubation in OR  Informed Consent: I have reviewed the patients History and Physical, chart, labs and discussed the procedure including the risks, benefits and alternatives for the proposed anesthesia with the patient or authorized representative who has indicated his/her understanding and acceptance.     Dental advisory given  Plan Discussed with: CRNA  Anesthesia Plan Comments:         Anesthesia Quick Evaluation

## 2023-10-27 NOTE — Lactation Note (Signed)
This note was copied from a baby's chart.  NICU Lactation Consultation Note  Patient Name: Charlotte Mitchell [id number] Date: 10/27/2023 Age:34 hours  Reason for consult: Initial assessment; Other (Comment); NICU baby; Term; Infant < 6lbs (SGA, No PNC, maternal IUDE, amphetamines (+), hepatitis C (+))  SUBJECTIVE Spoke to [employer] and she confirmed that Charlotte Mitchell feeding choice on admission for baby is formula. Baby got admitted to the NICU due to respiratory distress, she's currently on Similac 24 calorie formula. Lactation services are completed at this time.  OBJECTIVE Infant data: Mother's Current Feeding Choice: Formula  O2 Device: HHFNC O2 Flow Rate (L/min): 2 L/min FiO2 (%): 21 %  Infant feeding assessment Scale for Readiness: 3   Maternal data: [id number] C-Section, Low Transverse   ASSESSMENT Infant: Feeding method: Tube/Gavage (Bolus)  Maternal: No data recorded  INTERVENTIONS/PLAN Interventions: No data recorded  Plan: Consult Status: Complete   Charlotte Mitchell S Charlotte Mitchell 10/27/2023, 5:04 PM

## 2023-10-28 ENCOUNTER — Encounter (HOSPITAL_COMMUNITY): Payer: Self-pay | Admitting: Obstetrics & Gynecology

## 2023-10-28 DIAGNOSIS — Z9851 Tubal ligation status: Secondary | ICD-10-CM

## 2023-10-28 LAB — CBC
HCT: 26.2 % — ABNORMAL LOW (ref 36.0–46.0)
Hemoglobin: 8.5 g/dL — ABNORMAL LOW (ref 12.0–15.0)
MCH: 30.9 pg (ref 26.0–34.0)
MCHC: 32.4 g/dL (ref 30.0–36.0)
MCV: 95.3 fL (ref 80.0–100.0)
Platelets: 149 10*3/uL — ABNORMAL LOW (ref 150–400)
RBC: 2.75 MIL/uL — ABNORMAL LOW (ref 3.87–5.11)
RDW: 13.7 % (ref 11.5–15.5)
WBC: 12.4 10*3/uL — ABNORMAL HIGH (ref 4.0–10.5)
nRBC: 0 % (ref 0.0–0.2)

## 2023-10-28 LAB — RUBELLA SCREEN: Rubella: 2.77 {index} (ref >0.99)

## 2023-10-28 MED ORDER — BUPRENORPHINE HCL-NALOXONE HCL 8-2 MG SL SUBL
1.0000 | SUBLINGUAL_TABLET | Freq: Once | SUBLINGUAL | Status: AC
  Administered 2023-10-28: 1 via SUBLINGUAL
  Filled 2023-10-28: qty 1

## 2023-10-28 MED ORDER — OXYCODONE HCL 5 MG PO TABS: 5.0000 mg | ORAL_TABLET | Freq: Four times a day (QID) | ORAL | Status: DC | PRN

## 2023-10-28 MED ORDER — BUPRENORPHINE HCL-NALOXONE HCL 8-2 MG SL SUBL: 1.0000 | SUBLINGUAL_TABLET | Freq: Three times a day (TID) | SUBLINGUAL | Status: DC

## 2023-10-28 NOTE — Social Work (Addendum)
Per [facility] CPS social worker ([place], [phone]) MOB agreed not to have contact the with infant since she is actively withdrawing. FOB and relatives cannot have contact with the infant. No visitors at this time.   Barriers to infant's discharge.  Vivi Barrack, MSW, LCSW Clinical Social Worker  [phone] 10/28/2023  2:29 PM

## 2023-10-28 NOTE — Anesthesia Postprocedure Evaluation (Signed)
Anesthesia Post Note  Patient: Charlotte Mitchell  Procedure(s) Performed: CESAREAN SECTION WITH BILATERAL TUBAL LIGATION (Bilateral: Abdomen)     Patient location during evaluation: PACU Anesthesia Type: General Level of consciousness: awake and alert Pain management: pain level controlled Vital Signs Assessment: post-procedure vital signs reviewed and stable Respiratory status: spontaneous breathing, nonlabored ventilation, respiratory function stable and patient connected to nasal cannula oxygen Cardiovascular status: blood pressure returned to baseline and stable Postop Assessment: no apparent nausea or vomiting Anesthetic complications: no   No notable events documented.  Last Vitals:  Vitals:   10/28/23 0315 10/28/23 0755  BP:  138/85  Pulse:  61  Resp:  18  Temp:  36.6 C  SpO2: 98%     Last Pain:  Vitals:   10/28/23 0755  TempSrc: Oral  PainSc: 0-No pain                 Kennieth Rad

## 2023-10-28 NOTE — Clinical Social Work Maternal (Addendum)
CLINICAL SOCIAL WORK MATERNAL/CHILD NOTE  Patient Details  Name: Charlotte Mitchell MRN: [id number] Date of Birth: 07-Jun-1991  Date:  10/28/2023  Clinical Social Worker Initiating Note:  Vivi Barrack, LCSW Date/Time: Initiated:  10/28/23/1004     Child's Name:  Charlotte Mitchell (MOB has not chosen a last name)   Biological Parents:  Mother, Father (MOB: [name] 1991/08/29 FOB: [name])   Need for Interpreter:  None   Reason for Referral:  Current Substance Use/Substance Use During Pregnancy  , Late or No Prenatal Care     Address:  [address] [place] [address]    Phone number:  [phone] (home)     Additional phone number:   Household Members/Support Persons (HM/SP):       HM/SP Name Relationship DOB or Age  HM/SP -1        HM/SP -2        HM/SP -3        HM/SP -4        HM/SP -5        HM/SP -6        HM/SP -7        HM/SP -8          Natural Supports (not living in the home):      Professional Supports: None   Employment: Unemployed   Type of Work:     Education:   (GED)   Homebound arranged:    Surveyor, quantity Resources:      Other Resources:      Cultural/Religious Considerations Which May Impact Care:  Christian   Strengths:      Psychotropic Medications:         Pediatrician:       Pediatrician List:   Leonie Douglas      Pediatrician Fax Number:    Risk Factors/Current Problems:  Basic Needs  , Substance Use     Cognitive State:  Able to Concentrate  , Goal Oriented     Mood/Affect:  Calm  , Comfortable     CSW Assessment: CSW met with MOB at the bedside. CSW observed MOB lying in bed asleep but was easily awakened at her name being called. CSW introduced CSW role and asked if it was a good time to complete an assessment with MOB. MOB reported, "yeah." MOB presented calm and receptive. CSW asked MOB how she has been feeling since giving  birth. MOB stated, " I am feeling okay, just tired." CSW inquired if the demographic information on file was correct for MOB. MOB reported that she lives at [address], in [place]. MOB stated that she did not know the zip code and that it was a hotel. MOB stated she stays there alone. CSW asked for a contact number. MOB reported, "[phone]." CSW inquired about FOB. MOB reported that FOB name is "[name]." CSW asked FOB date of birth and if he would be involved with the infant. MOB reported, " I cannot think of his birthday off the top of my head." She reported that they were not together and that he would not be involved. CSW asked if MOB received WIC/FS assistance. MOB reported, "no". CSW asked if MOB wanted to apply for [facility] and FS. MOB reported that she had WIC/FS in the past and would like to apply again. CSW offered to assist with  WIC application and gave MOB instruction on how to follow up with Department of Social Service for food stamps. CSW inquired about MOB supports. MOB reported, "I don't have any."   CSW inquired why MOB did not receive PNC during the pregnancy. MOB reported "I just did not." CSW explained the hospital drug screen policy and made MOB aware the infants Urine Drug Screen resulted positive for Amphetamines. CSW inquired about MOB substance use during the pregnancy. MOB reported, " I used Fentanyl." CSW asked if MOB had used any other substance during the pregnancy. MOB reported, "no, fentanyl is all I used". CSW asked MOB the last time she used. MOB reported "I did before I gave birth." CSW asked MOB how often she used during the pregnancy. MOB reported " I used every day during the pregnancy, a couple times a day." CSW informed MOB that CSW would have to make a report to [facility] because the infant's UDS was positive for Amphetamines. CSW asked if MOB had any questions. MOB reported, "no". CSW inquired if MOB had CPS history. MOB  reported, "yeah, with my last one." CSW asked MOB for the child's name and date of birth. MOB reported " Charlotte Mitchell, March 20,2025." CSW asked why CPS was involved. MOB reported, "for drug use." CSW asked if CPS had custody. MOB reported, "yeah, I don't know what happen to him. I think he was adopted." CSW inquired about MOB other children. MOB reported, "they live with my father, Charlotte Mitchell."  CSW asked MOB the children names. She reported, "Charlotte Mitchell, March 03, 2006, Charlotte Mitchell, July 31, 2008, and Charlotte Mitchell, February 03, 2012, lives my ex., husband.   CSW inquired if MOB wanted Substance Use treatment. MOB reported that she had let the doctor know that she wanted to start Suboxone treatment because she was having cravings. CSW offered to follow up with the RN. CSW inquired if MOB had insurance. MOB reported, "I had it with my third child." CSW inquired if MOB wanted to apply for Medicaid. MOB reported, "yes." CSW offered to follow up with [facility] financial navigator. CSW inquired if MOB had a facility in the community that would continue to prescribe the Suboxone without insurance. MOB reported, "no." CSW offered to reach out to treatment facilities to explore options. MOB gave CSW permission to reach out to treatment facilities.   CSW inquired if MOB had mental health history. MOB reported, "I am depressed, and I have always had it." CSW inquired if MOB was interested in mental health treatment. MOB reported "no". CSW asked if it was alright to leave MOB with mental health resources and provide brief education. MOB agreed. CSW provided brief education regarding the baby blues period vs. perinatal mood disorders, discussed treatment at [facility]. CSW assessed MOB for safety. MOB denied SI/HI and domestic violence concerns.   CSW asked if MOB had essential items for the infant. MOB reported that she did not have items for the infant. MOB reported, "No, I  want to keep her, but I don't know how to. I want what's best for her." CSW let MOB know that Child Protected Services would follow up with her and CSW encouraged MOB to talk with CPS about her wishes for the infant. CSW asked MOB if there was anything else she needed at the moment. MOB reported no and thanked CSW.   CSW made a report to [facility] Child Protective intake Loralyn Freshwater that infant's UDS  was positive for Amphetamines. CSW Will Continue to Monitor Umbilical Cord Tissue Drug Screen Results and Make Report if Warranted.   CSW reached out to [facility] financial navigator regarding Medicaid and left a voicemail.  CSW provided MOB with information for the [facility] for continued support with MAT.   Barriers to infant's discharge.   CSW will continue to offer support and resources to family while infant remains in NICU.   CSW Plan/Description:  Psychosocial Support and Ongoing Assessment of Needs, Perinatal Mood and Anxiety Disorder (PMADs) Education, Hospital Drug Screen Policy Information, Child Protective Service Report  , CSW Will Continue to Monitor Umbilical Cord Tissue Drug Screen Results and Make Report if Warranted    Clearance Coots, LCSW 10/28/2023, 12:54 PM

## 2023-10-28 NOTE — Progress Notes (Signed)
Pt left AMA per Earlene Plater, RN at [phone]. Upon this RN arrival, pt not in the room and had left the facility.   Quincy Simmonds, RN

## 2023-10-28 NOTE — Social Work (Signed)
CSW escorted assigned [facility] CPS social worker ([place], [phone]) to [facility] room 1S16 to complete an assessment and to infant's room 3S21.    Per CPS Child psychotherapist, the assigned [facility] CPS Supervisor is Larey Dresser, [phone].    Barriers to infant's discharge.    Vivi Barrack, MSW, LCSW Clinical Social Worker  [phone] 10/28/2023  1:56 PM

## 2023-10-28 NOTE — Progress Notes (Signed)
Pt called this RN into room with questions on discharge. This Rn explained that the pt is not going to be discharged today but that it may be tomorrow or Sunday at the latest. The pt expressed still want to leave despite rn education and stated "I feel fine". This RN called primary MD at 1729 and explained situation. MD stated if the pt still wanted to leave she can. Rn removed pt IV and pt is ready to leave

## 2023-10-28 NOTE — Progress Notes (Signed)
POSTPARTUM PROGRESS NOTE  POD #1  Subjective:  Charlotte Mitchell is a 32 y.o. [id number] s/p rLTCS and bilateral salpingectomy at unknown gestation- per NICU, baby was term. Resting comfortably with no acute complaints overnight. She denies any problems with ambulating, voiding or po intake. Denies nausea or vomiting. She has passed flatus, no BM.  Pain is well controlled.  Lochia minimal Denies fever/chills/chest pain/SOB.  no HA, no blurry vision, no RUQ pain  Objective: Blood pressure 105/69, pulse 66, temperature 98 F (36.7 C), temperature source Oral, resp. rate 17, height 5\' 4"  (1.626 m), weight 68 kg, last menstrual period 02/28/2023, SpO2 98%, unknown if currently breastfeeding.  Physical Exam:  General: alert, cooperative and no distress Chest: no respiratory distress Heart: regular rate and rhythm Abdomen: soft, nontender, +BS Uterine Fundus: firm, appropriately tender Incision: C/D/I with honeycomb DVT Evaluation: No calf swelling or tenderness Extremities: 1+ edema noted.  Scabs covered with bandages- appear clean and dry Skin: warm, dry  Results for orders placed or performed during the hospital encounter of 10/27/23 (from the past 24 hour(s))  Protein / creatinine ratio, urine     Status: Abnormal   Collection Time: 10/27/23  6:40 AM  Result Value Ref Range   Creatinine, Urine 28 mg/dL   Total Protein, Urine 9 mg/dL   Protein Creatinine Ratio 0.32 (H) 0.00 - 0.15 mg/mg[Cre]    Assessment/Plan: Charlotte Mitchell is a 32 y.o. [id number] s/p rLTCS, bilateral salpingectomy,  POD#1 complicated by:  1) Opioid use- pain well controlled, plan to start suboxone per Dr. Audie Clear recommendations  2) Hep C +, []  f/u testing ordered Normal LFTs  -continue routine postop care -baby in NICU -Lovenox for DVT prophylaxis   Contraception: salpingectomy Feeding: bottle  Dispo: Continue routine postop care and care as outlined above   LOS: 1 day   Myna Hidalgo, DO Faculty  Attending, [facility] 10/28/2023, 6:16 AM

## 2023-10-29 ENCOUNTER — Encounter (HOSPITAL_COMMUNITY): Payer: Self-pay | Admitting: Obstetrics & Gynecology

## 2023-10-29 LAB — HCV RNA QUANT RFLX ULTRA OR GENOTYP
HCV RNA Qnt(log copy/mL): UNDETERMINED {Log}
HepC Qn: NOT DETECTED [IU]/mL

## 2023-10-30 LAB — TYPE AND SCREEN
ABO/RH(D): O POS
Antibody Screen: NEGATIVE
Unit division: 0
Unit division: 0

## 2023-10-30 LAB — BPAM RBC
Blood Product Expiration Date: 202412152359
Blood Product Expiration Date: 202412152359
Unit Type and Rh: 5100
Unit Type and Rh: 5100

## 2023-10-31 ENCOUNTER — Encounter (HOSPITAL_COMMUNITY): Payer: Self-pay | Admitting: Obstetrics & Gynecology

## 2023-10-31 LAB — SURGICAL PATHOLOGY

## 2023-11-02 LAB — MISC LABCORP TEST (SEND OUT): Labcorp test code: 738526

## 2023-11-07 ENCOUNTER — Telehealth (HOSPITAL_COMMUNITY): Payer: Self-pay | Admitting: *Deleted

## 2023-11-07 NOTE — Telephone Encounter (Signed)
11/07/2023  Name: SHARICKA LANGLIE MRN: [id number] DOB: March 28, 1991  Reason for Call:  Transition of Care Hospital Discharge Call  Contact Status: Patient Contact Status: Unable to contact ("call cannot be completed at this time")  Language assistant needed:          Follow-Up Questions:    Inocente Salles Postnatal Depression Scale:  In the Past 7 Days:    PHQ2-9 Depression Scale:     Discharge Follow-up:    Post-discharge interventions: NA  Salena Saner, RN 11/07/2023  15:05
